# Patient Record
Sex: Female | Born: 1975 | Race: White | Hispanic: No | Marital: Married | Smoking: Never smoker
Health system: Southern US, Community
[De-identification: ages and names within clinical notes are randomized; demographics above are authoritative.]

## PROBLEM LIST (undated history)

## (undated) DIAGNOSIS — B001 Herpesviral vesicular dermatitis: Secondary | ICD-10-CM

## (undated) DIAGNOSIS — Z8619 Personal history of other infectious and parasitic diseases: Secondary | ICD-10-CM

## (undated) HISTORY — DX: Herpesviral vesicular dermatitis: B00.1

## (undated) HISTORY — PX: WISDOM TOOTH EXTRACTION: SHX21

## (undated) HISTORY — DX: Personal history of other infectious and parasitic diseases: Z86.19

## (undated) HISTORY — PX: KNEE SURGERY: SHX244

## (undated) HISTORY — PX: TUBAL LIGATION: SHX77

---

## 2004-05-14 ENCOUNTER — Emergency Department: Payer: Self-pay | Admitting: Emergency Medicine

## 2006-07-05 ENCOUNTER — Emergency Department: Payer: Self-pay | Admitting: Emergency Medicine

## 2006-09-07 ENCOUNTER — Emergency Department: Payer: Self-pay | Admitting: Emergency Medicine

## 2008-07-10 ENCOUNTER — Emergency Department: Payer: Self-pay | Admitting: Emergency Medicine

## 2008-09-15 ENCOUNTER — Emergency Department: Payer: Self-pay | Admitting: Emergency Medicine

## 2009-06-16 ENCOUNTER — Ambulatory Visit: Payer: Self-pay | Admitting: Family Medicine

## 2009-06-16 DIAGNOSIS — Z9189 Other specified personal risk factors, not elsewhere classified: Secondary | ICD-10-CM | POA: Insufficient documentation

## 2009-06-16 DIAGNOSIS — F329 Major depressive disorder, single episode, unspecified: Secondary | ICD-10-CM

## 2009-06-16 DIAGNOSIS — F3289 Other specified depressive episodes: Secondary | ICD-10-CM | POA: Insufficient documentation

## 2009-06-16 DIAGNOSIS — M2550 Pain in unspecified joint: Secondary | ICD-10-CM

## 2009-06-16 DIAGNOSIS — M25569 Pain in unspecified knee: Secondary | ICD-10-CM

## 2009-06-16 DIAGNOSIS — H8110 Benign paroxysmal vertigo, unspecified ear: Secondary | ICD-10-CM | POA: Insufficient documentation

## 2009-06-16 DIAGNOSIS — R21 Rash and other nonspecific skin eruption: Secondary | ICD-10-CM | POA: Insufficient documentation

## 2009-06-16 DIAGNOSIS — E0590 Thyrotoxicosis, unspecified without thyrotoxic crisis or storm: Secondary | ICD-10-CM | POA: Insufficient documentation

## 2009-08-04 ENCOUNTER — Ambulatory Visit: Payer: Self-pay | Admitting: Family Medicine

## 2009-08-04 DIAGNOSIS — M259 Joint disorder, unspecified: Secondary | ICD-10-CM | POA: Insufficient documentation

## 2009-08-05 LAB — CONVERTED CEMR LAB
AST: 16 units/L (ref 0–37)
Albumin: 3.7 g/dL (ref 3.5–5.2)
Alkaline Phosphatase: 80 units/L (ref 39–117)
Bilirubin, Direct: 0.1 mg/dL (ref 0.0–0.3)
Calcium: 8.9 mg/dL (ref 8.4–10.5)
GFR calc non Af Amer: 148.94 mL/min (ref >60)
Glucose, Bld: 82 mg/dL (ref 70–99)
Potassium: 4.2 meq/L (ref 3.5–5.1)
Sodium: 138 meq/L (ref 135–145)
Total CHOL/HDL Ratio: 3
VLDL: 10.6 mg/dL (ref 0.0–40.0)

## 2009-08-07 LAB — CONVERTED CEMR LAB: Cyclic Citrullin Peptide Ab: 1.3 units (ref <7)

## 2009-09-16 ENCOUNTER — Ambulatory Visit: Payer: Self-pay | Admitting: Family Medicine

## 2009-09-16 ENCOUNTER — Encounter (INDEPENDENT_AMBULATORY_CARE_PROVIDER_SITE_OTHER): Payer: Self-pay | Admitting: *Deleted

## 2009-09-16 DIAGNOSIS — G43009 Migraine without aura, not intractable, without status migrainosus: Secondary | ICD-10-CM | POA: Insufficient documentation

## 2009-09-16 LAB — CONVERTED CEMR LAB: KOH Prep: POSITIVE

## 2009-10-20 ENCOUNTER — Other Ambulatory Visit: Admission: RE | Admit: 2009-10-20 | Discharge: 2009-10-20 | Payer: Self-pay | Admitting: Family Medicine

## 2009-10-20 ENCOUNTER — Ambulatory Visit: Payer: Self-pay | Admitting: Family Medicine

## 2009-10-20 ENCOUNTER — Encounter (INDEPENDENT_AMBULATORY_CARE_PROVIDER_SITE_OTHER): Payer: Self-pay | Admitting: *Deleted

## 2009-10-28 DIAGNOSIS — R87811 Vaginal high risk human papillomavirus (HPV) DNA test positive: Secondary | ICD-10-CM

## 2009-11-12 LAB — CONVERTED CEMR LAB: Pap Smear: NEGATIVE

## 2009-11-13 ENCOUNTER — Encounter (INDEPENDENT_AMBULATORY_CARE_PROVIDER_SITE_OTHER): Payer: Self-pay | Admitting: *Deleted

## 2010-06-22 NOTE — Letter (Signed)
Summary: Out of Work  [employer] at [facility]  [address] [place], [place] [address]   Phone: [phone]  Fax: [phone]    September 16, 2009   Employee:  [name]    To Whom It May Concern:   For Medical reasons, please excuse the above named employee from work for the following dates:  Start:  September 15, 2009 3:03 PM   End:   May return on April 28,2011  If you need additional information, please feel free to contact our office.         Sincerely,    Kerby Nora MD

## 2010-06-22 NOTE — Assessment & Plan Note (Signed)
Summary: NEW PT  CYD   Vital Signs:  Patient profile:   35 year old female Height:      63 inches Weight:      193.2 pounds BMI:     34.35 Temp:     98.3 degrees F oral Pulse rate:   76 / minute Pulse rhythm:   regular BP sitting:   110 / 80  (left arm) Cuff size:   regular  Vitals Entered By: Benny Lennert CMA Duncan Dull) (June 16, 2009 10:17 AM)  History of Present Illness: Chief complaint new patient to be established   Has not had regular MD in over 8 years.  Has history of thyroid issues...? high treated with tapazol..has not hasd TSH checked since.   B knee pain...pain with squating or standing a while. Pain getting out of bed in AM and pain after sitting a while. Clicking in knees. No remembered fall or injury.  OCc hip pain.  No redness or swelling.  Occ wrists hurt.Marland Kitchenocc swelling in hands and fingers.   Rash on neck and under breasts.. not itchy...present for years.   Preventive Screening-Counseling & Management  Caffeine-Diet-Exercise     Does Patient Exercise: no      Drug Use:  no.    Problems Prior to Update: None  Current Medications (verified): 1)  None  Past History:  Past medical, surgical, family and social histories (including risk factors) reviewed, and no changes noted (except as noted below).  Past Medical History: Current Problems:  THYROID DISORDER (ICD-246.9) 2000 DEPRESSION (ICD-311) CHICKENPOX, HX OF (ICD-V15.9)    Past Surgical History: tubes tied 2003  Family History: Reviewed history and no changes required. Family History of Arthritis (RA) Grandparents with congestive heart failure mother: unknown father: RA  Social History: Reviewed history and no changes required. 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 10 pack year historyOccupation:  employed Drug Use:  no Does Patient Exercise:  no  Review of Systems General:   Denies fatigue and fever. CV:  Denies chest pain or discomfort. Resp:  Denies shortness of breath. GI:  Denies abdominal pain. GU:  Denies dysuria.  Physical Exam  General:  obese appearing female in NAD Mouth:  Oral mucosa and oropharynx without lesions or exudates.  Teeth in good repair. Neck:  no carotid bruit or thyromegaly no cervical or supraclavicular lymphadenopathy  Lungs:  Normal respiratory effort, chest expands symmetrically. Lungs are clear to auscultation, no crackles or wheezes. Heart:  Normal rate and regular rhythm. S1 and S2 normal without gallop, murmur, click, rub or other extra sounds. Abdomen:  Bowel sounds positive,abdomen soft and non-tender without masses, organomegaly or hernias noted. Msk:  B knees nontender to palp.Marland Kitchenexcept slight pain left lat joint line..no redness no swelling, good stablity, B crepitus Pulses:  R and L posterior tibial pulses are full and equal bilaterally  Extremities:  no edema Skin:  dry scaly skin, pink at neck and on torso..some irregular patches..no central clearing Psych:  Cognition and judgment appear intact. Alert and cooperative with normal attention span and concentration. No apparent delusions, illusions, hallucinations   Impression & Recommendations:  Problem # 1:  HYPERTHYROIDISM (ICD-242.90) ? Marland Kitchen.due for reeval.   Problem # 2:  PAIN IN JOINT, MULTIPLE SITES (ICD-719.49) Has strong family history...does not appear like RA in symptoms or exam, but some wrist pain symetric. Will eval with RF and anti CCP antibody.  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  Follow up if not improving.   Problem # 4:  RASH AND OTHER NONSPECIFIC SKIN ERUPTION (ICD-782.1) Most liekly eczema, ...but given location and worse  in summer consider fungal infection.  Start with steroid cream two times a day x 2 weeks..call if worsening.  Her  updated medication list for this problem includes:    Triamcinolone Acetonide 0.1 % Crea (Triamcinolone acetonide) .Marland Kitchen... Aaa two times a day x 2 weeks, call if rash worsening.  Complete Medication List: 1)  Meclizine Hcl 25 Mg Tabs (Meclizine hcl) .Marland Kitchen.. 1 tab by mouth q 6hour as needed vertigo 2)  Triamcinolone Acetonide 0.1 % Crea (Triamcinolone acetonide) .... Aaa two times a day x 2 weeks, call if rash worsening.  Patient Instructions: 1)  Fasting lipids, CMET Dx v77.91, TSH Dx 244.9 RF, anti ccp antibody 719.94 2)  Scheduled CPX.  3)  Glucosamine 500 mg by mouth three times a day.  4)  MAy also use ibuprofen 800 mg every 8 hours for pain.  Prescriptions: TRIAMCINOLONE ACETONIDE 0.1 % CREA (TRIAMCINOLONE ACETONIDE) AAA two times a day x 2 weeks, call if rash worsening.  #15 gm x 0   Entered and Authorized by:   Kerby Nora MD   Signed by:   Kerby Nora MD on 06/16/2009   Method used:   [facility]        [facility]  [address] [facility]       [address]       [place], [place]  [address]       Ph: [phone] or [phone]       Fax: [phone]   RxID:   [id number]   Flu Vaccine Next Due:  Refused TD Result Date:  05/23/2008 TD Result:  given TD Next Due:  10 yr

## 2010-06-22 NOTE — Letter (Signed)
Summary: Work Dietitian at [facility]  [address] [place], [place] [address]   Phone: [phone]  Fax: [phone]    Today's Date: Oct 20, 2009  Name of Patient: Zoe Martin  The above named patient had a medical visit today at: 9:30 am   Please take this into consideration when reviewing the time away from work/school.    Special Instructions:  [x  ] None  [  ] To be off the remainder of today, returning to the normal work / school schedule tomorrow.  [  ] To be off until the next scheduled appointment on ______________________.  [  ] Other ________________________________________________________________ ________________________________________________________________________   Sincerely yours,   Kerby Nora MD

## 2010-06-22 NOTE — Letter (Signed)
Summary: Results Follow up Letter  [place] at [facility]  [address] [place], [place] [address]   Phone: [phone]  Fax: [phone]    11/13/2009 MRN: [id number]     Zoe Martin [address] [place], [place]  [address]    Dear Ms. Melanee Left,  The following are the results of your recent test(s):  Test         Result    Pap Smear:        Normal __x___  Not Normal _____ Comments:Repeat in 1 year ______________________________________________________ Cholesterol: LDL(Bad cholesterol):         Your goal is less than:         HDL (Good cholesterol):       Your goal is more than: Comments:  ______________________________________________________ Mammogram:        Normal _____  Not Normal _____ Comments:  ___________________________________________________________________ Hemoccult:        Normal _____  Not normal _______ Comments:    _____________________________________________________________________ Other Tests:    We routinely do not discuss normal results over the telephone.  If you desire a copy of the results, or you have any questions about this information we can discuss them at your next office visit.   Sincerely,  Kerby Nora MD

## 2010-06-22 NOTE — Assessment & Plan Note (Signed)
Summary: CPX/DLO   Vital Signs:  Patient profile:   35 year old female Height:      63 inches Weight:      198.8 pounds BMI:     35.34 Temp:     98.4 degrees F oral Pulse rate:   76 / minute Pulse rhythm:   regular BP sitting:   110 / 70  (left arm) Cuff size:   regular  Vitals Entered By: Benny Lennert CMA Duncan Dull) (Oct 20, 2009 9:26 AM)  History of Present Illness: Chief complaint cpx with pap  The patient is here for annual wellness exam and preventative care.    She also has the following chronic and acute issues:  Migraine: Keeping headache diary. 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 Onlt one in last month and a half. 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 Has not had to use triptan.  Exercsiing on treadmill 2 times a week.  Helathy eating.   Problems Prior to Update: 1)  Headache  (ICD-784.0) 2)  Screening For Lipoid Disorders  (ICD-V77.91) 3)  Unspecified Disorder of Hand Joint  (ICD-719.94) 4)  Rash and Other Nonspecific Skin Eruption  (ICD-782.1) 5)  Patello-femoral Syndrome  (ICD-719.46) 6)  Pain in Joint, Multiple Sites  (ICD-719.49) 7)  Benign Positional Vertigo  (ICD-386.11) 8)  Hyperthyroidism  (ICD-242.90) 9)  Hx of Depression  (ICD-311) 10)  Chickenpox, Hx of  (ICD-V15.9)  Current Medications (verified): 1)  Ketoconazole 2 % Crea (Ketoconazole) .... Apply To Affected Area Two Times A Day  Allergies (verified): No Known Drug Allergies  Past History:  Past medical, surgical, family and social histories (including risk factors) reviewed, and no changes noted (except as noted below).  Past Medical History: Reviewed history from 06/16/2009 and no changes required. Current Problems:  THYROID DISORDER (ICD-246.9) 2000 DEPRESSION (ICD-311) CHICKENPOX, HX OF (ICD-V15.9)    Past Surgical History: Reviewed history from 06/16/2009 and no changes required. tubes tied 2003  Family History: Reviewed history from 06/16/2009 and no  changes required. Family History of Arthritis (RA) Grandparents with congestive heart failure mother: unknown father: RA  Social History: Reviewed history from 06/16/2009 and no changes required. 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 10 pack year history  Review of Systems       No intereste din STD testing.  General:  Denies fatigue and fever. CV:  Denies chest pain or discomfort. Resp:  Denies shortness of breath, sputum productive, and wheezing. GI:  Denies abdominal pain, bloody stools, constipation, and diarrhea. GU:  Denies abnormal vaginal bleeding and dysuria. Derm:  Denies lesion(s). Psych:  Denies anxiety and depression.  Physical Exam  General:  obese appearing female in NAD Eyes:  No corneal or conjunctival inflammation noted. EOMI. Perrla. Funduscopic exam benign, without hemorrhages, exudates or papilledema. Vision grossly normal. Ears:  External ear exam shows no significant lesions or deformities.  Otoscopic examination reveals clear canals, tympanic membranes are intact bilaterally without bulging, retraction, inflammation or discharge. Hearing is grossly normal bilaterally. Nose:  External nasal examination shows no deformity or inflammation. Nasal mucosa are pink and moist without lesions or exudates. Mouth:  Oral mucosa and oropharynx without lesions or exudates.  Teeth in good repair. Neck:  No deformities, masses, or tenderness noted. Chest Wall:  No deformities, masses, or tenderness noted. Breasts:  No mass, nodules, thickening, tenderness, bulging, retraction, inflamation, nipple discharge or skin changes noted.   Lungs:  Normal respiratory  effort, chest expands symmetrically. Lungs are clear to auscultation, no crackles or wheezes. Heart:  Normal rate and regular rhythm. S1 and S2 normal without gallop, murmur, click, rub or other extra sounds. Abdomen:   Bowel sounds positive,abdomen soft and non-tender without masses, organomegaly or hernias noted. Genitalia:  Pelvic Exam:        External: normal female genitalia without lesions or masses        Vagina: normal without lesions or masses        Cervix: normal without lesions or masses        Adnexa: normal bimanual exam without masses or fullness        Uterus: normal by palpation        Pap smear: performed Msk:  No deformity or scoliosis noted of thoracic or lumbar spine.   Pulses:  R and L posterior tibial pulses are full and equal bilaterally  Extremities:  no edema Skin:  Intact without suspicious lesions or rashes Psych:  Cognition and judgment appear intact. Alert and cooperative with normal attention span and concentration. No apparent delusions, illusions, hallucinations   Impression & Recommendations:  Problem # 1:  PREVENTIVE HEALTH CARE (ICD-V70.0) The patient's preventative maintenance and recommended screening tests for an annual wellness exam were reviewed in full today. Brought up to date unless services declined.  Counselled on the importance of diet, exercise, and its role in overall health and mortality. The patient's FH and SH was reviewed, including their home life, tobacco status, and drug and alcohol status.     Problem # 2:  ROUTINE GYNECOLOGICAL EXAMINATION (ICD-V72.31) PAP pending. Refused STD testing.   Problem # 3:  MIGRAINE, COMMON (ICD-346.10) Improved.  The following medications were removed from the medication list:    Sumatriptan Succinate 100 Mg Tabs (Sumatriptan succinate) .Marland Kitchen... 1 tab by mouth x 1 may repeat in 2 hours if headache continues.  Problem # 4:  RASH AND OTHER NONSPECIFIC SKIN ERUPTION (ICD-782.1) Resoved tinea versicolor.  Her updated medication list for this problem includes:    Ketoconazole 2 % Crea (Ketoconazole) .Marland Kitchen... Apply to affected area two times a day  Complete Medication List: 1)  Ketoconazole 2 % Crea (Ketoconazole) ....  Apply to affected area two times a day  Patient Instructions: 1)  Increase exercisie, work on healthy eating habits. 2)  You need to lose weight. Consider a lower calorie diet and regular exercise.  3)  Follow up in 1 year or earlier as needed.  Current Allergies (reviewed today): No known allergies

## 2010-06-22 NOTE — Assessment & Plan Note (Signed)
Summary: BAD HEADACHE/DLO   Vital Signs:  Patient profile:   35 year old female Height:      63 inches Weight:      194.6 pounds BMI:     34.60 Temp:     98.2 degrees F oral Pulse rate:   76 / minute Pulse rhythm:   regular BP sitting:   110 / 70  (left arm) Cuff size:   regular  Vitals Entered By: Benny Lennert CMA Duncan Dull) (September 16, 2009 2:22 PM)  History of Present Illness: Chief complaint Bad headache for 2 days  Having headaches once a week in past 2-3 months. This most recent headache has been more severe than others. Very sensitive to light, dizzy. Pain over front of face. Occ sees white spots with bad headaceh. No nausea. Throbbing pain. Using aleve as needed, usually .. last night used percocet leftover..no improvemnt. No numbness, no weakness.   Has appt Friday with eye MD. Straining to see small writing on wires at work.  Feel like neck rash worse..no improvemtn with triamcinolone in last few months..maybe worse. Rash is not itchy.     Problems Prior to Update: 1)  Screening For Lipoid Disorders  (ICD-V77.91) 2)  Unspecified Disorder of Hand Joint  (ICD-719.94) 3)  Rash and Other Nonspecific Skin Eruption  (ICD-782.1) 4)  Patello-femoral Syndrome  (ICD-719.46) 5)  Pain in Joint, Multiple Sites  (ICD-719.49) 6)  Benign Positional Vertigo  (ICD-386.11) 7)  Hyperthyroidism  (ICD-242.90) 8)  Hx of Depression  (ICD-311) 9)  Chickenpox, Hx of  (ICD-V15.9)  Current Medications (verified): 1)  Ketoconazole 2 % Crea (Ketoconazole) .... Apply To Affected Area Two Times A Day 2)  Sumatriptan Succinate 100 Mg Tabs (Sumatriptan Succinate) .Marland Kitchen.. 1 Tab By Mouth X 1 May Repeat in 2 Hours If Headache Continues.  Allergies (verified): No Known Drug Allergies  Past History:  Past medical, surgical, family and social histories (including risk factors) reviewed, and no changes noted (except as noted below).  Past Medical History: Reviewed history from 06/16/2009 and  no changes required. Current Problems:  THYROID DISORDER (ICD-246.9) 2000 DEPRESSION (ICD-311) CHICKENPOX, HX OF (ICD-V15.9)    Past Surgical History: Reviewed history from 06/16/2009 and no changes required. tubes tied 2003  Family History: Reviewed history from 06/16/2009 and no changes required. Family History of Arthritis (RA) Grandparents with congestive heart failure mother: unknown father: RA  Social History: Reviewed history from 06/16/2009 and no changes required. 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 10 pack year history  Review of Systems General:  Denies fatigue and fever. CV:  Denies chest pain or discomfort and swelling of feet. Resp:  Denies shortness of breath. GI:  Denies abdominal pain. GU:  Denies dysuria.  Physical Exam  General:  obese appearing female in NAD Mouth:  Oral mucosa and oropharynx without lesions or exudates.  Teeth in good repair. Neck:  no carotid bruit or thyromegaly .nods  Lungs:  Normal respiratory effort, chest expands symmetrically. Lungs are clear to auscultation, no crackles or wheezes. Heart:  Normal rate and regular rhythm. S1 and S2 normal without gallop, murmur, click, rub or other extra sounds. Pulses:  R and L posterior tibial pulses are full and equal bilaterally  Neurologic:  No cranial nerve deficits noted. Station and gait are normal. Plantar reflexes are down-going bilaterally. DTRs are symmetrical throughout. Sensory, motor and coordinative functions appear intact. Skin:  dry scaly plaques, well coircumscribed, pink  at neck and on torso..some irregular patches..no central clearing but  definate edge apparant Psych:  Cognition and judgment appear intact. Alert and cooperative with normal attention span and concentration. No apparent delusions, illusions, hallucinations   Impression & Recommendations:  Problem # 1:   HEADACHE (ICD-784.0) Migraines triggered b poor lifestyle and eye strain at work. Eye exam recommended. Keep headache doary . Info on triggers to avoid provided.  Triptan for acute headache. Follow up..if still frequent consider prophylaxisis Her updated medication list for this problem includes:    Sumatriptan Succinate 100 Mg Tabs (Sumatriptan succinate) .Marland Kitchen... 1 tab by mouth x 1 may repeat in 2 hours if headache continues.  Headache diary reviewed.  Problem # 2:  RASH AND OTHER NONSPECIFIC SKIN ERUPTION (ICD-782.1)  Worsened with steroid cream..has distinct edge to patches... fungal infection likely.  KOH was positive for fungal structures.  Start topical antifungal.  The following medications were removed from the medication list:    Triamcinolone Acetonide 0.1 % Crea (Triamcinolone acetonide) .Marland Kitchen... Aaa two times a day x 2 weeks, call if rash worsening. Her updated medication list for this problem includes:    Ketoconazole 2 % Crea (Ketoconazole) .Marland Kitchen... Apply to affected area two times a day  Orders: Wet Prep ([id number])  Complete Medication List: 1)  Ketoconazole 2 % Crea (Ketoconazole) .... Apply to affected area two times a day 2)  Sumatriptan Succinate 100 Mg Tabs (Sumatriptan succinate) .Marland Kitchen.. 1 tab by mouth x 1 may repeat in 2 hours if headache continues.  Patient Instructions: 1)  Keep appt for eye exam. 2)  Apply antifungal to rash two times a day until resolved. 3)  Use sumatriptan for headache.  4)  Keep headache diary, increase fluids, rest and exercsie. 5)  Follow up in 3-4 weeks migraine.  Prescriptions: SUMATRIPTAN SUCCINATE 100 MG TABS (SUMATRIPTAN SUCCINATE) 1 tab by mouth x 1 may repeat in 2 hours if headache continues.  #9 x 0   Entered and Authorized by:   Kerby Nora MD   Signed by:   Kerby Nora MD on 09/16/2009   Method used:   [facility] [id number]       [address]       [place], [place]  [address]       Ph:  [phone] or [phone]       Fax: [phone]   RxID:   [id number] KETOCONAZOLE 2 % CREA (KETOCONAZOLE) Apply to affected area two times a day  #30-60 gm x 1   Entered and Authorized by:   Kerby Nora MD   Signed by:   Kerby Nora MD on 09/16/2009   Method used:   [facility]        [facility] [id number]       [address]       [place], [place]  [address]       Ph: [phone] or [phone]       Fax: [phone]   RxID:   [id number]   Current Allergies (reviewed today): No known allergies   Laboratory Results    Wet [place]/KOH Source: skin KOH pos spagetti and meatball fungal strucures

## 2016-12-10 ENCOUNTER — Emergency Department
Admission: EM | Admit: 2016-12-10 | Discharge: 2016-12-10 | Disposition: A | Discharge status: Home / Self Care | Payer: Commercial Managed Care - PPO | Attending: Emergency Medicine | Admitting: Emergency Medicine

## 2016-12-10 ENCOUNTER — Emergency Department: Payer: Commercial Managed Care - PPO

## 2016-12-10 ENCOUNTER — Encounter: Payer: Self-pay | Admitting: Emergency Medicine

## 2016-12-10 DIAGNOSIS — M7122 Synovial cyst of popliteal space [Baker], left knee: Secondary | ICD-10-CM | POA: Diagnosis not present

## 2016-12-10 DIAGNOSIS — Z87891 Personal history of nicotine dependence: Secondary | ICD-10-CM | POA: Insufficient documentation

## 2016-12-10 DIAGNOSIS — M25562 Pain in left knee: Secondary | ICD-10-CM | POA: Diagnosis not present

## 2016-12-10 MED ORDER — NAPROXEN 500 MG PO TABS: 500.0000 mg | ORAL_TABLET | Freq: Two times a day (BID) | ORAL | Status: AC

## 2016-12-10 MED ORDER — OXYCODONE-ACETAMINOPHEN 7.5-325 MG PO TABS: 1.0000 | ORAL_TABLET | Freq: Four times a day (QID) | ORAL | 0 refills | Status: AC | PRN

## 2016-12-10 NOTE — ED Notes (Signed)
Pt verbalizes understanding of discharge instructions.

## 2016-12-10 NOTE — ED Provider Notes (Signed)
[facility] Emergency Department Provider Note   ____________________________________________   First MD Initiated Contact with Patient 12/10/16 1219     (approximate)  I have reviewed the triage vital signs and the nursing notes.   HISTORY  Chief Complaint Knee Pain    HPI Zoe Martin is a 41 y.o. female patient complaining of increasing left knee pain. Patient has a history of meniscus debriefing 4 years ago. Patient states  works requiresprolonged standing. Patient state pain starts after she takes the weight off of the left lower extremity. Patient stated decreased range of motion with flexion of the knee. Patient also complaining of popliteal pain. Patient rates the pain as 8/10. No palliative measures for complaint.   History reviewed. No pertinent past medical history.  Patient Active Problem List   Diagnosis Date Noted  . VAG HIGH RISK HUMAN PAPILLOMAVIRUS DNA TEST POS 10/28/2009  . MIGRAINE, COMMON 09/16/2009  . UNSPECIFIED DISORDER OF HAND JOINT 08/04/2009  . HYPERTHYROIDISM 06/16/2009  . DEPRESSION 06/16/2009  . BENIGN POSITIONAL VERTIGO 06/16/2009  . 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 PAIN IN JOINT, MULTIPLE SITES 06/16/2009  . RASH AND OTHER NONSPECIFIC SKIN ERUPTION 06/16/2009  . CHICKENPOX, HX OF 06/16/2009    Past Surgical History:  Procedure Laterality Date  . KNEE SURGERY Left   . TUBAL LIGATION      Prior to Admission medications   Medication Sig Start Date End Date Taking? Authorizing Provider  naproxen (NAPROSYN) 500 MG tablet Take 1 tablet (500 mg total) by mouth 2 (two) times daily with a meal. 12/10/16   Joni Reining, PA-C  oxyCODONE-acetaminophen (PERCOCET) 7.5-325 MG tablet Take 1 tablet by mouth every 6 (six) hours as needed for severe pain. 12/10/16   Joni Reining, PA-C    Allergies Patient has no known allergies.  No family history on file.  Social History Social History  Substance Use  Topics  . Smoking status: Former Games developer  . Smokeless tobacco: Not on file  . Alcohol use Not on file    Review of Systems  Constitutional: No fever/chills Eyes: No visual changes. ENT: No sore throat. Cardiovascular: Denies chest pain. Respiratory: Denies shortness of breath. Gastrointestinal: No abdominal pain.  No nausea, no vomiting.  No diarrhea.  No constipation. Genitourinary: Negative for dysuria. Musculoskeletal: Left knee pain Skin: Negative for rash. Neurological: Negative for headaches, focal weakness or numbness. Psychiatric:Depression Endocrine:Hypothyroidism ____________________________________________   PHYSICAL EXAM:  VITAL SIGNS: ED Triage Vitals  Enc Vitals Group     BP 12/10/16 1213 136/80     Pulse Rate 12/10/16 1213 71     Resp 12/10/16 1213 20     Temp 12/10/16 1213 98 F (36.7 C)     Temp Source 12/10/16 1213 Oral     SpO2 12/10/16 1213 100 %     Weight 12/10/16 1214 191 lb (86.6 kg)     Height 12/10/16 1214 5\' 3"  (1.6 m)     Head Circumference --      Peak Flow --      Pain Score 12/10/16 1212 8     Pain Loc --      Pain Edu? --      Excl. in GC? --     Constitutional: Alert and oriented. Well appearing and in no acute distress. Cardiovascular: Normal rate, regular rhythm. Grossly normal heart sounds.  Good peripheral circulation. Respiratory: Normal respiratory effort.  No retractions. Lungs CTAB. Gastrointestinal: Soft and nontender. No distention. No abdominal bruits.  No CVA tenderness. Musculoskeletal: No obvious deformity to the left knee. Patient has moderate guarding palpation lateral aspect of the femoral tibial joint. Decreased range of motion with flexion. Neurologic:  Normal speech and language. No gross focal neurologic deficits are appreciated. No gait instability. Skin:  Skin is warm, dry and intact. No rash noted. Psychiatric: Mood and affect are normal. Speech and behavior are  normal.  ____________________________________________   LABS (all labs ordered are listed, but only abnormal results are displayed)  Labs Reviewed - No data to display ____________________________________________  EKG   ____________________________________________  RADIOLOGY  Dg Knee Complete 4 Views Left  Result Date: 12/10/2016 CLINICAL DATA:  Left lateral knee pain. EXAM: LEFT KNEE - COMPLETE 4+ VIEW COMPARISON:  None. FINDINGS: No evidence of fracture, dislocation, or joint effusion. Mild medial joint space narrowing present consistent with osteoarthritis. No bony lesions or destruction. Soft tissues are unremarkable. IMPRESSION: Medial joint space narrowing consistent with osteoarthritis. No acute injury findings. Electronically Signed   By: Irish LackGlenn  Yamagata M.D.   On: 12/10/2016 13:02    _X-rays reveal decreased joint space between the fibula and an tubular on the lateral aspect of the left leg. Patient also has a calcified lesions consistent with Baker's cyst. ___________________________________________   PROCEDURES  Procedure(s) performed: None  Procedures  Critical Care performed: No  ____________________________________________   INITIAL IMPRESSION / ASSESSMENT AND PLAN / ED COURSE  Pertinent labs & imaging results that were available during my care of the patient were reviewed by me and considered in my medical decision making (see chart for details).  Left leg pain secondary to arthritis and Baker's cyst. Discussed x-ray finding with patient. Patient will follow-up with new PCP was scheduled for minute in 2 days. Patient given discharge care instructions and return work no.    ____________________________________________   FINAL CLINICAL IMPRESSION(S) / ED DIAGNOSES  Final diagnoses:  Acute pain of left knee  Baker's cyst of knee, left      NEW MEDICATIONS STARTED DURING THIS VISIT:  New Prescriptions   NAPROXEN (NAPROSYN) 500 MG TABLET    Take 1  tablet (500 mg total) by mouth 2 (two) times daily with a meal.   OXYCODONE-ACETAMINOPHEN (PERCOCET) 7.5-325 MG TABLET    Take 1 tablet by mouth every 6 (six) hours as needed for severe pain.     Note:  This document was prepared using Dragon voice recognition software and may include unintentional dictation errors.    Joni ReiningSmith, Leondro Coryell K, PA-C 12/10/16 1318    Governor RooksLord, Rebecca, MD 12/10/16 [phone]

## 2016-12-10 NOTE — ED Notes (Signed)
Pt reports had knee surgery back in 2014 30% of meniscus removed to left knee and since then pt reports she has been dealing with pain reports past week pain has increased. Denies any falls or injuries to left knee. No visible swelling noted upon assessment, pt denies any other symptom at present

## 2016-12-10 NOTE — ED Triage Notes (Signed)
L knee pain x 1 week. No new injury. History surgery same knee.

## 2016-12-10 NOTE — Discharge Instructions (Signed)
Follow-up for scheduled PCP appointment

## 2016-12-12 ENCOUNTER — Ambulatory Visit: Payer: Self-pay | Admitting: Family

## 2016-12-12 DIAGNOSIS — Z0289 Encounter for other administrative examinations: Secondary | ICD-10-CM

## 2017-12-11 ENCOUNTER — Emergency Department
Admission: EM | Admit: 2017-12-11 | Discharge: 2017-12-12 | Disposition: A | Discharge status: Home / Self Care | Payer: Commercial Managed Care - PPO | Attending: Student in an Organized Health Care Education/Training Program | Admitting: Student in an Organized Health Care Education/Training Program

## 2017-12-11 ENCOUNTER — Encounter: Payer: Self-pay | Admitting: Emergency Medicine

## 2017-12-11 DIAGNOSIS — F332 Major depressive disorder, recurrent severe without psychotic features: Secondary | ICD-10-CM

## 2017-12-11 DIAGNOSIS — R45851 Suicidal ideations: Secondary | ICD-10-CM | POA: Insufficient documentation

## 2017-12-11 DIAGNOSIS — T40601A Poisoning by unspecified narcotics, accidental (unintentional), initial encounter: Secondary | ICD-10-CM

## 2017-12-11 DIAGNOSIS — F1910 Other psychoactive substance abuse, uncomplicated: Secondary | ICD-10-CM

## 2017-12-11 DIAGNOSIS — F1010 Alcohol abuse, uncomplicated: Secondary | ICD-10-CM

## 2017-12-11 DIAGNOSIS — F1110 Opioid abuse, uncomplicated: Secondary | ICD-10-CM | POA: Insufficient documentation

## 2017-12-11 DIAGNOSIS — F329 Major depressive disorder, single episode, unspecified: Secondary | ICD-10-CM | POA: Insufficient documentation

## 2017-12-11 LAB — COMPREHENSIVE METABOLIC PANEL
ALT: 12 U/L (ref 0–44)
AST: 18 U/L (ref 15–41)
Albumin: 4.2 g/dL (ref 3.5–5.0)
Alkaline Phosphatase: 84 U/L (ref 38–126)
Anion gap: 8 (ref 5–15)
BUN: 10 mg/dL (ref 6–20)
CO2: 21 mmol/L — ABNORMAL LOW (ref 22–32)
Calcium: 9 mg/dL (ref 8.9–10.3)
Chloride: 111 mmol/L (ref 98–111)
Creatinine, Ser: 0.74 mg/dL (ref 0.44–1.00)
GFR calc Af Amer: >60 mL/min (ref >60)
GFR calc non Af Amer: >60 mL/min (ref >60)
Glucose, Bld: 141 mg/dL — ABNORMAL HIGH (ref 70–99)
Potassium: 3.6 mmol/L (ref 3.5–5.1)
Sodium: 140 mmol/L (ref 135–145)
Total Bilirubin: 0.8 mg/dL (ref 0.3–1.2)
Total Protein: 7.6 g/dL (ref 6.5–8.1)

## 2017-12-11 LAB — URINE DRUG SCREEN, QUALITATIVE (ARMC ONLY)
AMPHETAMINES, UR SCREEN: NOT DETECTED
BENZODIAZEPINE, UR SCRN: NOT DETECTED
Cannabinoid 50 Ng, Ur ~~LOC~~: NOT DETECTED
Cocaine Metabolite,Ur ~~LOC~~: NOT DETECTED
MDMA (Ecstasy)Ur Screen: NOT DETECTED
Methadone Scn, Ur: NOT DETECTED
Opiate, Ur Screen: NOT DETECTED
Phencyclidine (PCP) Ur S: NOT DETECTED
TRICYCLIC, UR SCREEN: NOT DETECTED

## 2017-12-11 LAB — CBC
HEMATOCRIT: 41 % (ref 35.0–47.0)
HEMOGLOBIN: 14.3 g/dL (ref 12.0–16.0)
MCH: 32.4 pg (ref 26.0–34.0)
MCHC: 34.8 g/dL (ref 32.0–36.0)
MCV: 93.1 fL (ref 80.0–100.0)
Platelets: 285 10*3/uL (ref 150–440)
RBC: 4.4 MIL/uL (ref 3.80–5.20)
RDW: 12.9 % (ref 11.5–14.5)
WBC: 7.7 10*3/uL (ref 3.6–11.0)

## 2017-12-11 LAB — SALICYLATE LEVEL: Salicylate Lvl: <7 mg/dL (ref 2.8–30.0)

## 2017-12-11 LAB — POCT PREGNANCY, URINE: Preg Test, Ur: NEGATIVE

## 2017-12-11 LAB — ACETAMINOPHEN LEVEL: Acetaminophen (Tylenol), Serum: <10 ug/mL — ABNORMAL LOW (ref 10–30)

## 2017-12-11 LAB — ETHANOL: Alcohol, Ethyl (B): 22 mg/dL — ABNORMAL HIGH (ref <10)

## 2017-12-11 MED ORDER — VITAMIN B-1 100 MG PO TABS
100.0000 mg | ORAL_TABLET | Freq: Every day | ORAL | Status: DC
  Administered 2017-12-11 – 2017-12-12 (×2): 100 mg via ORAL
  Filled 2017-12-11 (×2): qty 1

## 2017-12-11 MED ORDER — FOLIC ACID 1 MG PO TABS
1.0000 mg | ORAL_TABLET | Freq: Every day | ORAL | Status: DC
  Administered 2017-12-11 – 2017-12-12 (×2): 1 mg via ORAL
  Filled 2017-12-11 (×2): qty 1

## 2017-12-11 MED ORDER — THIAMINE HCL 100 MG/ML IJ SOLN: 100.0000 mg | Freq: Every day | INTRAMUSCULAR | Status: DC

## 2017-12-11 MED ORDER — ADULT MULTIVITAMIN W/MINERALS CH
1.0000 | ORAL_TABLET | Freq: Every day | ORAL | Status: DC
  Administered 2017-12-11 – 2017-12-12 (×2): 1 via ORAL
  Filled 2017-12-11 (×2): qty 1

## 2017-12-11 MED ORDER — HYDROXYZINE HCL 25 MG PO TABS
50.0000 mg | ORAL_TABLET | Freq: Once | ORAL | Status: AC
Start: 2017-12-11 — End: 2017-12-11
  Administered 2017-12-11: 50 mg via ORAL
  Filled 2017-12-11: qty 2

## 2017-12-11 MED ORDER — LORAZEPAM 1 MG PO TABS
1.0000 mg | ORAL_TABLET | Freq: Four times a day (QID) | ORAL | Status: DC | PRN
  Filled 2017-12-11: qty 1

## 2017-12-11 MED ORDER — LORAZEPAM 2 MG/ML IJ SOLN: 1.0000 mg | Freq: Four times a day (QID) | INTRAMUSCULAR | Status: DC | PRN

## 2017-12-11 NOTE — ED Notes (Signed)
This RN called poison control at this time.

## 2017-12-11 NOTE — ED Notes (Signed)
Pt. Transferred to B[facility] from ED to room 2 after screening for contraband. Report to include Situation, Background, Assessment and [facility]. Pt. Oriented to unit including Q15 minute rounds as well as the security cameras for their protection. Patient is alert and oriented, warm and dry in no acute distress. Patient denies SI, HI, and AVH. Pt. Encouraged to let me know if needs arise.

## 2017-12-11 NOTE — ED Notes (Addendum)
Poison control RN suggested AST/ALT and tylenol level, Salicylate and Aspirin level.  Observe for 6 hours from time of ingestion.  Ideally asymptomatic with normal vitals.  Recommended not giving narcan if patient gets drowsy.  Recommended EKG.

## 2017-12-11 NOTE — ED Notes (Signed)
TTS at bedside. 

## 2017-12-11 NOTE — ED Provider Notes (Addendum)
[facility] Emergency Department Provider Note    First MD Initiated Contact with Patient 12/11/17 1726     (approximate)  I have reviewed the triage vital signs and the nursing notes.   HISTORY  Chief Complaint Suicidal  Level V Caveat:  AMS etoh abuse  HPI Zoe Martin is a 42 y.o. female Zoe Martin intoxicated with report of suicidal ideation.  Patient shakes her head when asked if there is any suicidal ideation right now.  No plan for self-harm.  States when trying to obtain additional history from the patient "Zoe Martin is an Asshole "and that "I want some sprinkles "no evidence of pain.  No intentional self-harm.  States that she had 3 drinks of alcohol.   Please report documents patient took approximately 8 oxycodone.  Patient does not provide any history of this.   History reviewed. No pertinent past medical history. No family history on file. Past Surgical History:  Procedure Laterality Date  . KNEE SURGERY Left   . TUBAL LIGATION     Patient Active Problem List   Diagnosis Date Noted  . Alcohol abuse 12/11/2017  . Opiate overdose (HCC) 12/11/2017  . Severe recurrent major depression without psychotic features (HCC) 12/11/2017  . VAG HIGH RISK HUMAN PAPILLOMAVIRUS DNA TEST POS 10/28/2009  . MIGRAINE, COMMON 09/16/2009  . UNSPECIFIED DISORDER OF HAND JOINT 08/04/2009  . HYPERTHYROIDISM 06/16/2009  . DEPRESSION 06/16/2009  . BENIGN POSITIONAL VERTIGO 06/16/2009  . 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 PAIN IN JOINT, MULTIPLE SITES 06/16/2009  . RASH AND OTHER NONSPECIFIC SKIN ERUPTION 06/16/2009  . CHICKENPOX, HX OF 06/16/2009      Prior to Admission medications   Medication Sig Start Date End Date Taking? Authorizing Provider  naproxen (NAPROSYN) 500 MG tablet Take 1 tablet (500 mg total) by mouth 2 (two) times daily with a meal. 12/10/16   Zoe Reining, PA-C  oxyCODONE-acetaminophen (PERCOCET) 7.5-325 MG tablet Take 1 tablet by  mouth every 6 (six) hours as needed for severe pain. 12/10/16   Zoe Reining, PA-C    Allergies Patient has no known allergies.    Social History Social History   Tobacco Use  . Smoking status: Former Smoker  Substance Use Topics  . Alcohol use: Not on file  . Drug use: Not on file    Review of Systems Patient denies headaches, rhinorrhea, blurry vision, numbness, shortness of breath, chest pain, edema, cough, abdominal pain, nausea, vomiting, diarrhea, dysuria, fevers, rashes or hallucinations unless otherwise stated above in HPI. ____________________________________________   PHYSICAL EXAM:  VITAL SIGNS: Vitals:   12/11/17 1646 12/11/17 2006  BP: 140/69 122/89  Pulse: (!) 105 84  Resp: 18 20  Temp: 99 F (37.2 C) 97.8 F (36.6 C)  SpO2: 98% 99%    Constitutional: Alert but intoxicated appearing Eyes: Conjunctivae are normal.  Head: Atraumatic. Nose: No congestion/rhinnorhea. Mouth/Throat: Mucous membranes are moist.   Neck: No stridor. Painless ROM.  Cardiovascular: Normal rate, regular rhythm. Grossly normal heart sounds.  Good peripheral circulation. Respiratory: Normal respiratory effort.  No retractions. Lungs CTAB. Gastrointestinal: Soft and nontender. No distention. No abdominal bruits. No CVA tenderness. Genitourinary: deferred Musculoskeletal: No lower extremity tenderness nor edema.  No joint effusions. Neurologic: No gross focal neurologic deficits are appreciated. No facial droop Skin:  Skin is warm, dry and intact. No rash noted. Psychiatric: intoxicated ____________________________________________   LABS (all labs ordered are listed, but only abnormal results are displayed)  Results for orders placed or performed  during the hospital encounter of 12/11/17 (from the past 24 hour(s))  Comprehensive metabolic panel     Status: Abnormal   Collection Time: 12/11/17  4:47 PM  Result Value Ref Range   Sodium 140 135 - 145 mmol/L   Potassium 3.6  3.5 - 5.1 mmol/L   Chloride 111 98 - 111 mmol/L   CO2 21 (L) 22 - 32 mmol/L   Glucose, Bld 141 (H) 70 - 99 mg/dL   BUN 10 6 - 20 mg/dL   Creatinine, Ser 1.61 0.44 - 1.00 mg/dL   Calcium 9.0 8.9 - 09.6 mg/dL   Total Protein 7.6 6.5 - 8.1 g/dL   Albumin 4.2 3.5 - 5.0 g/dL   AST 18 15 - 41 U/L   ALT 12 0 - 44 U/L   Alkaline Phosphatase 84 38 - 126 U/L   Total Bilirubin 0.8 0.3 - 1.2 mg/dL   GFR calc non Af Amer >60 >60 mL/min   GFR calc Af Amer >60 >60 mL/min   Anion gap 8 5 - 15  Ethanol     Status: Abnormal   Collection Time: 12/11/17  4:47 PM  Result Value Ref Range   Alcohol, Ethyl (B) 22 (H) <10 mg/dL  Salicylate level     Status: None   Collection Time: 12/11/17  4:47 PM  Result Value Ref Range   Salicylate Lvl <7.0 2.8 - 30.0 mg/dL  Acetaminophen level     Status: Abnormal   Collection Time: 12/11/17  4:47 PM  Result Value Ref Range   Acetaminophen (Tylenol), Serum <10 (L) 10 - 30 ug/mL  cbc     Status: None   Collection Time: 12/11/17  4:47 PM  Result Value Ref Range   WBC 7.7 3.6 - 11.0 K/uL   RBC 4.40 3.80 - 5.20 MIL/uL   Hemoglobin 14.3 12.0 - 16.0 g/dL   HCT 04.5 40.9 - 81.1 %   MCV 93.1 80.0 - 100.0 fL   MCH 32.4 26.0 - 34.0 pg   MCHC 34.8 32.0 - 36.0 g/dL   RDW 91.4 78.2 - 95.6 %   Platelets 285 150 - 440 K/uL  Urine Drug Screen, Qualitative     Status: Abnormal   Collection Time: 12/11/17  4:47 PM  Result Value Ref Range   Tricyclic, Ur Screen NONE DETECTED NONE DETECTED   Amphetamines, Ur Screen NONE DETECTED NONE DETECTED   MDMA (Ecstasy)Ur Screen NONE DETECTED NONE DETECTED   Cocaine Metabolite,Ur [place] NONE DETECTED NONE DETECTED   Opiate, Ur Screen NONE DETECTED NONE DETECTED   Phencyclidine (PCP) Ur S NONE DETECTED NONE DETECTED   Cannabinoid 50 Ng, Ur [place] NONE DETECTED NONE DETECTED   Barbiturates, Ur Screen (A) NONE DETECTED    Result not available. Reagent lot number recalled by manufacturer.   Benzodiazepine, Ur Scrn NONE DETECTED NONE DETECTED    Methadone Scn, Ur NONE DETECTED NONE DETECTED  Pregnancy, urine POC     Status: None   Collection Time: 12/11/17  5:09 PM  Result Value Ref Range   Preg Test, Ur NEGATIVE NEGATIVE   ____________________________________________  EKG My review and personal interpretation at Time: 17:23   Indication: med eval  Rate: 90  Rhythm: sinus Axis: normal Other: normal intervals, no stemi ____________________________________________  RADIOLOGY   ____________________________________________   PROCEDURES  Procedure(s) performed:  Procedures    Critical Care performed: no ____________________________________________   INITIAL IMPRESSION / ASSESSMENT AND PLAN / ED COURSE  Pertinent labs & imaging results that were available  during my care of the patient were reviewed by me and considered in my medical decision making (see chart for details).   DDX: Psychosis, delirium, medication effect, noncompliance, polysubstance abuse, Si, Hi, depression   Zoe Martin is a 42 y.o. who presents to the ED with for evaluation of SI.  Patient has psych history of etoh abuse.  Laboratory testing was ordered to evaluation for underlying electrolyte derangement or signs of underlying organic pathology to explain today's presentation.  Based on history and physical and laboratory evaluation, it appears that the patient's presentation is 2/2 underlying psychiatric disorder and will require further evaluation and management by inpatient psychiatry.  Patient was  made an IVC due to etoh intoxication with report of SI.  Disposition pending psychiatric evaluation.   Clinical Course as of Dec 11 2129  Mon Dec 11, 2017  2027 Poison control was notified by a similar facility.  They called back concerning prolonged QT however on review of the patient's EKG her QT is normal.  Machine is likely misinterpreting some baseline artifact.   [PR]  2041 Repeat EKG shows less baseline artifact with a QT of 414   [PR]      Clinical Course User Index [PR] Willy Eddyobinson, Lelan Cush, MD     As part of my medical decision making, I reviewed the following data within the electronic MEDICAL RECORD NUMBER Nursing notes reviewed and incorporated, Labs reviewed, notes from prior ED visits   ____________________________________________   FINAL CLINICAL IMPRESSION(S) / ED DIAGNOSES  Final diagnoses:  Suicidal ideation  Polysubstance abuse (HCC)      NEW MEDICATIONS STARTED DURING THIS VISIT:  New Prescriptions   No medications on file     Note:  This document was prepared using Dragon voice recognition software and may include unintentional dictation errors.    Willy Eddyobinson, Nur Rabold, MD 12/11/17 1740    Willy Eddyobinson, Jayd Cadieux, MD 12/11/17 2131

## 2017-12-11 NOTE — ED Notes (Signed)
IVC/Consult completed/ Pending Disposition 

## 2017-12-11 NOTE — BH Assessment (Signed)
TTS attempted assessment however pt too intoxicated to participate. [facility] will assess later this evening. Pt meets criteria for inpatient treatment per Dr. Toni Amendlapacs.

## 2017-12-11 NOTE — ED Notes (Signed)
Per IVC papers patient took 1/2 a bottle of percocet, approx. 8 tablets of 5/325

## 2017-12-11 NOTE — ED Notes (Addendum)
[facility] Romeo AppleBen[facility]  Patient has been cleared by Poison Control.

## 2017-12-11 NOTE — ED Notes (Signed)
Patient belongings placed in bag including, pants, shirt, sandals, small plush toy, cell phone, charger, and keys.

## 2017-12-11 NOTE — ED Notes (Signed)
Referral information for Psychiatric Hospitalization faxed to;   [facility] [phone]),   . [facility] ([phone]--[phone])  . [facility] [phone]),  . [facility] [phone] or [phone]),   . [place] [phone] or [phone])  . [facility] [phone]),   . [facility] [phone]),   . [facility] [phone]  . [facility] [phone] or [phone])

## 2017-12-11 NOTE — ED Notes (Signed)
[facility]  Preparing [facility] to transfer.

## 2017-12-11 NOTE — BH Assessment (Signed)
Assessment Note  Zoe Martin is an 42 y.o. female. Zoe Martin arrived to the ED by way or the police.  She reports that a friend of hers called the cops.  She states that he overreacted.  He assumed that she was in danger.  She reports that she took some pain killers and alcohol to get some sleep, and she has yet to get any sleep. She reports that her brain does not cut off for her to sleep.  She states that her job is stressful and she does not get any sleep. She states that she took the alcohol to shut her mind off to get some sleep. She denied symptoms of depression or anxiety. She denied having auditory or visual hallucinations.  She denied suicidal or homicidal ideation or intent.  She reports some life stressors.  IVC paperwork reports, "Texted friend she took  bottle of pills took approx. 8 oxi-codone"   Diagnosis: Substance misuse  Past Medical History: History reviewed. No pertinent past medical history.  Past Surgical History:  Procedure Laterality Date  . KNEE SURGERY Left   . TUBAL LIGATION      Family History: No family history on file.  Social History:  reports that she has quit smoking. She does not have any smokeless tobacco history on file. Her alcohol and drug histories are not on file.  Additional Social History:  Alcohol / Drug Use History of alcohol / drug use?: No history of alcohol / drug abuse(Patient denied history of substance abuse)  CIWA: CIWA-Ar BP: 122/89 Pulse Rate: 84 Nausea and Vomiting: no nausea and no vomiting Tactile Disturbances: none Tremor: no tremor Auditory Disturbances: not present Paroxysmal Sweats: no sweat visible Visual Disturbances: not present Anxiety: mildly anxious Headache, Fullness in Head: none present Agitation: normal activity Orientation and Clouding of Sensorium: oriented and can do serial additions CIWA-Ar Total: 1 COWS:    Allergies: No Known Allergies  Home Medications:  (Not in a hospital  admission)  OB/GYN Status:  Patient's last menstrual period was 11/11/2017 (approximate).  General Assessment Data Location of Assessment: [facility] ED TTS Assessment: In system Is this a Tele or Face-to-Face Assessment?: Face-to-Face Is this an Initial Assessment or a Re-assessment for this encounter?: Initial Assessment Marital status: Separated Maiden name: Primitivo GauzeFletcher Is patient pregnant?: No Pregnancy Status: No Living Arrangements: Alone Can pt return to current living arrangement?: Yes Admission Status: Involuntary Is patient capable of signing voluntary admission?: No Referral Source: Self/Family/Friend Insurance type: None  Medical Screening Exam [facility]) Medical Exam completed: Yes  Crisis Care Plan Living Arrangements: Alone Legal Guardian: Other:(Self) Name of Psychiatrist: None Name of Therapist: None  Education Status Is patient currently in school?: No Is the patient employed, unemployed or receiving disability?: Employed  Risk to self with the past 6 months Suicidal Ideation: No Has patient been a risk to self within the past 6 months prior to admission? : No Suicidal Intent: No Has patient had any suicidal intent within the past 6 months prior to admission? : No Is patient at risk for suicide?: No Suicidal Plan?: No Has patient had any suicidal plan within the past 6 months prior to admission? : No Access to Means: No What has been your use of drugs/alcohol within the last 12 months?: occasional use of alcohol Previous Attempts/Gestures: Yes How many times?: 1(in 2000) Other Self Harm Risks: Denied Triggers for Past Attempts: Unknown Intentional Self Injurious Behavior: None Family Suicide History: No Recent stressful life event(s): Other (Comment)(Difficulty  sleeping, work stress, personal life stressors) Persecutory voices/beliefs?: No Depression: No Depression Symptoms: (Denied) Substance abuse history and/or treatment for substance abuse?:  No Suicide prevention information given to non-admitted patients: Not applicable  Risk to Others within the past 6 months Homicidal Ideation: No Does patient have any lifetime risk of violence toward others beyond the six months prior to admission? : No Thoughts of Harm to Others: No Current Homicidal Intent: No Current Homicidal Plan: No Access to Homicidal Means: No Identified Victim: None identified History of harm to others?: No Assessment of Violence: None Noted Violent Behavior Description: denied Does patient have access to weapons?: No Criminal Charges Pending?: No Does patient have a court date: No Is patient on probation?: No  Psychosis Hallucinations: None noted Delusions: None noted  Mental Status Report Appearance/Hygiene: In scrubs Eye Contact: Good Motor Activity: Unremarkable Speech: Logical/coherent Level of Consciousness: Alert Mood: Irritable Affect: Irritable Anxiety Level: None Thought Processes: Coherent Judgement: Partial Obsessive Compulsive Thoughts/Behaviors: None  Cognitive Functioning Concentration: Normal Is patient IDD: No Is patient DD?: No Insight: Poor Impulse Control: Fair Appetite: Good Have you had any weight changes? : No Change Sleep: Decreased(Reports insomnia) Vegetative Symptoms: None  ADLScreening [facility] Assessment Services) Patient's cognitive ability adequate to safely complete daily activities?: Yes Patient able to express need for assistance with ADLs?: Yes Independently performs ADLs?: Yes (appropriate for developmental age)  Prior Inpatient Therapy Prior Inpatient Therapy: No  Prior Outpatient Therapy Prior Outpatient Therapy: Yes Prior Therapy Dates: 1999 Prior Therapy Facilty/Provider(s): [facility] Reason for Treatment: Depression Does patient have an ACCT team?: No Does patient have Intensive In-House Services?  : No Does patient have [facility] services? : No Does patient have P4CC services?:  No  ADL Screening (condition at time of admission) Patient's cognitive ability adequate to safely complete daily activities?: Yes Is the patient deaf or have difficulty hearing?: No Does the patient have difficulty seeing, even when wearing glasses/contacts?: No Does the patient have difficulty concentrating, remembering, or making decisions?: No Patient able to express need for assistance with ADLs?: Yes Does the patient have difficulty dressing or bathing?: No Independently performs ADLs?: Yes (appropriate for developmental age) Does the patient have difficulty walking or climbing stairs?: No Weakness of Legs: None Weakness of Arms/Hands: None  Home Assistive Devices/Equipment Home Assistive Devices/Equipment: None    Abuse/Neglect Assessment (Assessment to be complete while patient is alone) Abuse/Neglect Assessment Can Be Completed: (Patient denied a history of abuse)     Advance Directives (For Healthcare) Does Patient Have a Medical Advance Directive?: No Would patient like information on creating a medical advance directive?: No - Patient declined          Disposition:  Disposition Initial Assessment Completed for this Encounter: Yes  On Site Evaluation by:   Reviewed with Physician:    Justice Deeds 12/11/2017 8:55 PM

## 2017-12-11 NOTE — ED Notes (Signed)
Pt placed in Family Consult room, [employer] officer present.

## 2017-12-11 NOTE — ED Notes (Signed)
Patient on phone with daughter. (Daughter called and did not have pass code.  Writer spoke with patient and informed her daughter was on phone.  Patient agreed to speak with patient.)  Patient irritable saying, "Who told you I was here?  I can't sleep.  I haven't slept in days.  I just want to sleep."

## 2017-12-11 NOTE — ED Notes (Signed)
First Nurse Note:  Pt in via [facility] with [employer] officer, states he is awaiting IVC papers.  Per EMS reports, pt expressed SI via a text message to a friend.  Pt reports taking 5 Hydrocodone today and having 3 Vodka drinks.  Pt drowsy but easily arousable.

## 2017-12-11 NOTE — ED Notes (Signed)
Hourly rounding reveals patient sleeping in room. No complaints, stable, in no acute distress. Q15 minute rounds and monitoring via Security Cameras to continue. 

## 2017-12-11 NOTE — ED Notes (Signed)
Pt changed into behavorial clothing by this tech and Lorel MonacoLaura M (EDT), items obtained are as follows.....  Teal colored spaghetti strapped tank top, 1 brown pair of leggings, 1 white strapped sandal, 1 grey colored necklace with diamond shaped teal colored stone, 1 silver colored ring with 3 clear stones in it. 1 grey colored men's ring with silver colored angel wing attached  All items listed above placed in pt belongings bag by this tech and Lorel MonacoLaura M (EDT) name was labeled on outside.

## 2017-12-11 NOTE — Consult Note (Signed)
[facility] Psychiatry Consult   Reason for Consult: Consult for this 42 year old woman brought in after a friend felt that she had made suicidal statements Referring Physician: Quentin Cornwall Patient Identification: Zoe Martin MRN:  [id number] Principal Diagnosis: Severe recurrent major depression without psychotic features [facility]) Diagnosis:   Patient Active Problem List   Diagnosis Date Noted  . Alcohol abuse [F10.10] 12/11/2017  . Opiate overdose ([place]) [T40.601A] 12/11/2017  . Severe recurrent major depression without psychotic features ([place]) [F33.2] 12/11/2017  . VAG HIGH RISK HUMAN PAPILLOMAVIRUS DNA TEST POS [R87.811] 10/28/2009  . MIGRAINE, COMMON [G43.009] 09/16/2009  . UNSPECIFIED DISORDER OF HAND JOINT [M25.9] 08/04/2009  . HYPERTHYROIDISM [E05.90] 06/16/2009  . DEPRESSION [F32.9] 06/16/2009  . BENIGN POSITIONAL VERTIGO [H81.10] 06/16/2009  . 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 PAIN IN JOINT, MULTIPLE SITES [M25.50] 06/16/2009  . RASH AND OTHER NONSPECIFIC SKIN ERUPTION [R21] 06/16/2009  . CHICKENPOX, HX OF [Z91.89] 06/16/2009    Total Time spent with patient: 1 hour  Subjective:   Zoe Martin is a 42 y.o. female patient admitted with "[name] is an ass hat".  HPI: Patient seen chart reviewed.  Patient brought here after a friend called 911 because of concern that the patient might be suicidal.  Apparently the patient made statements to the person about "just wanting to go to sleep" or something to that effect but her friend interpreted this as being potentially suicidal.  Patient does state that she took all of the "painkillers" that were left in a bottle that she had because she wanted to go to sleep.  Also says that she had had several large glasses of orange juice and vodka today.  Patient is tearful and reports that she is chronically overwhelmed and stressed out and depressed.  Says that she has suicidal thoughts "every day".  Denies  hallucinations.  Complains bitterly of chronic insomnia.  Says that she cannot ever get a decent night sleep and that she works long hours every day and feels exhausted and emotionally overwhelmed.  Not currently getting any outpatient psychiatric treatment.  Medical history: Multiple transient medical problems does not appear to have any active ongoing medical issues.  Social history: Lives by herself.  Works as a Freight forwarder at a [employer].  Substance abuse history: Patient says she has been drinking more and more every day.  Drinks about a half of a bottle of vodka every day.  Also says that she uses a lot of pain medicine although there are not a lot of prescriptions documented.  Denies other drug use.  Past Psychiatric History: Patient states that she tried to kill herself by cutting her wrists in 2000.  Did not wind up apparently in a psychiatric hospital.  Not getting any active mental health treatment.  Does not recall any previous medication treatment.  Risk to Self:   Risk to Others:   Prior Inpatient Therapy:   Prior Outpatient Therapy:    Past Medical History: History reviewed. No pertinent past medical history.  Past Surgical History:  Procedure Laterality Date  . KNEE SURGERY Left   . TUBAL LIGATION     Family History: No family history on file. Family Psychiatric  History: Denies any family history Social History:  Social History   Substance and Sexual Activity  Alcohol Use Not on file     Social History   Substance and Sexual Activity  Drug Use Not on file    Social History   Socioeconomic History  . Marital  status: Married    Spouse name: Not on file  . Number of children: Not on file  . Years of education: Not on file  . Highest education level: Not on file  Occupational History  . Not on file  Social Needs  . Financial resource strain: Not on file  . Food insecurity:    Worry: Not on file    Inability: Not on file  . Transportation needs:     Medical: Not on file    Non-medical: Not on file  Tobacco Use  . Smoking status: Former Smoker  Substance and Sexual Activity  . Alcohol use: Not on file  . Drug use: Not on file  . Sexual activity: Not on file  Lifestyle  . Physical activity:    Days per week: Not on file    Minutes per session: Not on file  . Stress: Not on file  Relationships  . Social connections:    Talks on phone: Not on file    Gets together: Not on file    Attends religious service: Not on file    Active member of club or organization: Not on file    Attends meetings of clubs or organizations: Not on file    Relationship status: Not on file  Other Topics Concern  . Not on file  Social History Narrative  . Not on file   Additional Social History:    Allergies:  No Known Allergies  Labs:  Results for orders placed or performed during the hospital encounter of 12/11/17 (from the past 48 hour(s))  Comprehensive metabolic panel     Status: Abnormal   Collection Time: 12/11/17  4:47 PM  Result Value Ref Range   Sodium 140 135 - 145 mmol/L   Potassium 3.6 3.5 - 5.1 mmol/L   Chloride 111 98 - 111 mmol/L   CO2 21 (L) 22 - 32 mmol/L   Glucose, Bld 141 (H) 70 - 99 mg/dL   BUN 10 6 - 20 mg/dL   Creatinine, Ser 0.74 0.44 - 1.00 mg/dL   Calcium 9.0 8.9 - 10.3 mg/dL   Total Protein 7.6 6.5 - 8.1 g/dL   Albumin 4.2 3.5 - 5.0 g/dL   AST 18 15 - 41 U/L   ALT 12 0 - 44 U/L   Alkaline Phosphatase 84 38 - 126 U/L   Total Bilirubin 0.8 0.3 - 1.2 mg/dL   GFR calc non Af Amer >60 >60 mL/min   GFR calc Af Amer >60 >60 mL/min    Comment: (NOTE) The eGFR has been calculated using the CKD EPI equation. This calculation has not been validated in all clinical situations. eGFR's persistently <60 mL/min signify possible Chronic Kidney Disease.    Anion gap 8 5 - 15    Comment: Performed at [facility], [address] [place], [place] [address]  Ethanol     Status: Abnormal   Collection Time: 12/11/17   4:47 PM  Result Value Ref Range   Alcohol, Ethyl (B) 22 (H) <10 mg/dL    Comment: (NOTE) Lowest detectable limit for serum alcohol is 10 mg/dL. For medical purposes only. Performed at [facility], [address] [place], [place] [address]   Salicylate level     Status: None   Collection Time: 12/11/17  4:47 PM  Result Value Ref Range   Salicylate Lvl <0.0 2.8 - 30.0 mg/dL    Comment: Performed at [facility], [address] [place],  [address]  Acetaminophen level     Status: Abnormal   Collection Time: 12/11/17  4:47 PM  Result Value Ref Range   Acetaminophen (Tylenol), Serum <10 (L) 10 - 30 ug/mL    Comment: (NOTE) Therapeutic concentrations vary significantly. A range of 10-30 ug/mL  may be an effective concentration for many patients. However, some  are best treated at concentrations outside of this range. Acetaminophen concentrations >150 ug/mL at 4 hours after ingestion  and >50 ug/mL at 12 hours after ingestion are often associated with  toxic reactions. Performed at [facility], [address] [place], [place] [address]   cbc     Status: None   Collection Time: 12/11/17  4:47 PM  Result Value Ref Range   WBC 7.7 3.6 - 11.0 K/uL   RBC 4.40 3.80 - 5.20 MIL/uL   Hemoglobin 14.3 12.0 - 16.0 g/dL   HCT 41.0 35.0 - 47.0 %   MCV 93.1 80.0 - 100.0 fL   MCH 32.4 26.0 - 34.0 pg   MCHC 34.8 32.0 - 36.0 g/dL   RDW 12.9 11.5 - 14.5 %   Platelets 285 150 - 440 K/uL    Comment: Performed at [facility], [address] [place], [place] [address]  Urine Drug Screen, Qualitative     Status: Abnormal   Collection Time: 12/11/17  4:47 PM  Result Value Ref Range   Tricyclic, Ur Screen NONE DETECTED NONE DETECTED   Amphetamines, Ur Screen NONE DETECTED NONE DETECTED   MDMA (Ecstasy)Ur Screen NONE DETECTED NONE DETECTED   Cocaine Metabolite,Ur [place] NONE DETECTED NONE DETECTED   Opiate, Ur Screen NONE DETECTED NONE DETECTED    Phencyclidine (PCP) Ur S NONE DETECTED NONE DETECTED   Cannabinoid 50 Ng, Ur [place] NONE DETECTED NONE DETECTED   Barbiturates, Ur Screen (A) NONE DETECTED    Result not available. Reagent lot number recalled by manufacturer.   Benzodiazepine, Ur Scrn NONE DETECTED NONE DETECTED   Methadone Scn, Ur NONE DETECTED NONE DETECTED    Comment: (NOTE) Tricyclics + metabolites, urine    Cutoff 1000 ng/mL Amphetamines + metabolites, urine  Cutoff 1000 ng/mL MDMA (Ecstasy), urine              Cutoff 500 ng/mL Cocaine Metabolite, urine          Cutoff 300 ng/mL Opiate + metabolites, urine        Cutoff 300 ng/mL Phencyclidine (PCP), urine         Cutoff 25 ng/mL Cannabinoid, urine                 Cutoff 50 ng/mL Barbiturates + metabolites, urine  Cutoff 200 ng/mL Benzodiazepine, urine              Cutoff 200 ng/mL Methadone, urine                   Cutoff 300 ng/mL The urine drug screen provides only a preliminary, unconfirmed analytical test result and should not be used for non-medical purposes. Clinical consideration and professional judgment should be applied to any positive drug screen result due to possible interfering substances. A more specific alternate chemical method must be used in order to obtain a confirmed analytical result. Gas chromatography / mass spectrometry (GC/MS) is the preferred confirmat ory method. Performed at [facility], [address], [place], [place] [address]   Pregnancy, urine POC     Status: None   Collection Time: 12/11/17  5:09 PM  Result Value  Ref Range   Preg Test, Ur NEGATIVE NEGATIVE    Comment:        THE SENSITIVITY OF THIS METHODOLOGY IS >24 mIU/mL     No current facility-administered medications for this encounter.    Current Outpatient Medications  Medication Sig Dispense Refill  . naproxen (NAPROSYN) 500 MG tablet Take 1 tablet (500 mg total) by mouth 2 (two) times daily with a meal. 20 tablet 00  . oxyCODONE-acetaminophen  (PERCOCET) 7.5-325 MG tablet Take 1 tablet by mouth every 6 (six) hours as needed for severe pain. 12 tablet 0    Musculoskeletal: Strength & Muscle Tone: within normal limits Gait & Station: unable to stand Patient leans: N/A  Psychiatric Specialty Exam: Physical Exam  Nursing note and vitals reviewed. Constitutional: She appears well-developed and well-nourished.  HENT:  Head: Normocephalic and atraumatic.  Eyes: Pupils are equal, round, and reactive to light. Conjunctivae are normal.  Neck: Normal range of motion.  Cardiovascular: Regular rhythm and normal heart sounds.  Respiratory: Effort normal. No respiratory distress.  GI: Soft.  Musculoskeletal: Normal range of motion.  Neurological: She is alert.  Skin: Skin is warm and dry.  Psychiatric: Her mood appears anxious. Her affect is blunt. Her speech is delayed. She is agitated, slowed and withdrawn. She is not aggressive. Thought content is not paranoid. Cognition and memory are impaired. She expresses impulsivity. She expresses suicidal ideation. She expresses no homicidal ideation.    Review of Systems  Constitutional: Positive for malaise/fatigue.  HENT: Negative.   Eyes: Negative.   Respiratory: Negative.   Cardiovascular: Negative.   Gastrointestinal: Negative.   Musculoskeletal: Negative.   Skin: Negative.   Neurological: Negative.   Psychiatric/Behavioral: Positive for depression, substance abuse and suicidal ideas. Negative for hallucinations and memory loss. The patient is nervous/anxious and has insomnia.     Blood pressure 140/69, pulse (!) 105, temperature 99 F (37.2 C), temperature source Oral, resp. rate 18, height 5' 3"  (1.6 m), weight 88.5 kg (195 lb), last menstrual period 11/11/2017, SpO2 98 %.Body mass index is 34.54 kg/m.  General Appearance: Disheveled  Eye Contact:  Minimal  Speech:  Slow and Slurred  Volume:  Decreased  Mood:  Anxious, Depressed and Irritable  Affect:  Congruent  Thought  Process:  Disorganized  Orientation:  Full (Time, Place, and Person)  Thought Content:  Illogical, Rumination and Tangential  Suicidal Thoughts:  Yes.  with intent/plan  Homicidal Thoughts:  No  Memory:  Immediate;   Fair Recent;   Poor Remote;   Fair  Judgement:  Impaired  Insight:  Shallow  Psychomotor Activity:  Decreased  Concentration:  Concentration: Poor  Recall:  Poor  Fund of Knowledge:  Poor  Language:  Fair  Akathisia:  No  Handed:  Right  AIMS (if indicated):     Assets:  Financial Resources/Insurance Housing  ADL's:  Impaired  Cognition:  Impaired,  Mild  Sleep:        Treatment Plan Summary: Plan This is a 42 year old woman who comes into the hospital after making statements that her friend interpreted as being suicidal.  Indeed the patient did consume multiple narcotic pain medicines in an attempt to "go to sleep" on top of drinking heavily.  Currently her blood alcohol level is only 22 but she looks like she is still intoxicated.  Drug screen is not showing any opiates which suggests that it was probably the oxycodone that was prescribed a year ago that she took.  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  Additionally she talks about having suicidal thoughts "every day".  She is tearful and agitated.  Patient appears to still meet commitment criteria and requires inpatient treatment.  Case reviewed with ER doctor.  We can put her on detox protocol as needed.  Recommend admission.  Case reviewed with TTS and ER.  Disposition: Recommend psychiatric Inpatient admission when medically cleared. Supportive therapy provided about ongoing stressors.  Alethia Berthold, MD 12/11/2017 6:36 PM

## 2017-12-11 NOTE — ED Triage Notes (Signed)
Patient presents to the ED and is very tearful and occasionally hyperventilating in triage.  Patient came to the ED via EMS from home with IVC papers.  Patient states she texted her friend alex about taking a couple of pills after drinking a couple of alcoholic drinks.  Patient's friend then called 9-1-1.  Patient denies suicidal ideation at this time.

## 2017-12-11 NOTE — ED Notes (Signed)
Hourly rounding reveals patient in room. No complaints, stable, in no acute distress. Q15 minute rounds and monitoring via Security Cameras to continue. 

## 2017-12-12 ENCOUNTER — Inpatient Hospital Stay: Admission: AD | Admit: 2017-12-12 | Payer: No Typology Code available for payment source | Admitting: Psychiatry

## 2017-12-12 NOTE — ED Notes (Signed)
Hourly rounding reveals patient sleeping in room. No complaints, stable, in no acute distress. Q15 minute rounds and monitoring via Security Cameras to continue. 

## 2017-12-12 NOTE — ED Notes (Signed)
Pt given all belongings at this time, pt changing clothes. Denies any belongings missing from bag.

## 2017-12-12 NOTE — Consult Note (Signed)
[place] [facility] Consult   Reason for Consult: Follow-up note for this 43 year old woman who has been in the emergency room overnight Referring Physician: Archie Balboa Patient Identification: Zoe Martin MRN:  [id number] Principal Diagnosis: Severe recurrent major depression without psychotic features [facility]) Diagnosis:   Patient Active Problem List   Diagnosis Date Noted  . Alcohol abuse [F10.10] 12/11/2017  . Opiate overdose ([place]) [T40.601A] 12/11/2017  . Severe recurrent major depression without psychotic features ([place]) [F33.2] 12/11/2017  . VAG HIGH RISK HUMAN PAPILLOMAVIRUS DNA TEST POS [R87.811] 10/28/2009  . MIGRAINE, COMMON [G43.009] 09/16/2009  . UNSPECIFIED DISORDER OF HAND JOINT [M25.9] 08/04/2009  . HYPERTHYROIDISM [E05.90] 06/16/2009  . DEPRESSION [F32.9] 06/16/2009  . BENIGN POSITIONAL VERTIGO [H81.10] 06/16/2009  . 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 PAIN IN JOINT, MULTIPLE SITES [M25.50] 06/16/2009  . RASH AND OTHER NONSPECIFIC SKIN ERUPTION [R21] 06/16/2009  . CHICKENPOX, HX OF [Z91.89] 06/16/2009    Total Time spent with patient: 30 minutes  Subjective:   Zoe Martin is a 42 y.o. female patient admitted with "I am not going to kill myself".  HPI: Patient reassessed.  This 42 year old woman came into the hospital yesterday after taking an overdose of pain pills.  She was very agitated yesterday and there was indication of possible suicidal ideation.  Plan was for admission to the psychiatric unit but no bed had been available.  On reevaluation today the patient had gotten some sleep overnight.  She was adamant in stating that she had no suicidal ideation at all.  She was articulate about a plan for her career and her family into the future.  She repeated that her chief complaint is difficulty sleeping and that she simply wanted to get some sleep during the day yesterday.  Patient has not been acting out to try to harm herself in the  hospital.  Does not show symptoms of psychosis.  She is requesting released from the emergency room.  Past Psychiatric History: Patient has a apparent history of alcohol abuse but does not seem to have been involved in significant treatment for her mood in the past  Risk to Self: Suicidal Ideation: No Suicidal Intent: No Is patient at risk for suicide?: No Suicidal Plan?: No Access to Means: No What has been your use of drugs/alcohol within the last 12 months?: occasional use of alcohol How many times?: 1(in 2000) Other Self Harm Risks: Denied Triggers for Past Attempts: Unknown Intentional Self Injurious Behavior: None Risk to Others: Homicidal Ideation: No Thoughts of Harm to Others: No Current Homicidal Intent: No Current Homicidal Plan: No Access to Homicidal Means: No Identified Victim: None identified History of harm to others?: No Assessment of Violence: None Noted Violent Behavior Description: denied Does patient have access to weapons?: No Criminal Charges Pending?: No Does patient have a court date: No Prior Inpatient Therapy: Prior Inpatient Therapy: No Prior Outpatient Therapy: Prior Outpatient Therapy: Yes Prior Therapy Dates: 1999 Prior Therapy Facilty/Provider(s): [employer] [facility] Reason for Treatment: Depression Does patient have an ACCT team?: No Does patient have Intensive In-House Services?  : No Does patient have [facility] services? : No Does patient have P4CC services?: No  Past Medical History: History reviewed. No pertinent past medical history.  Past Surgical History:  Procedure Laterality Date  . KNEE SURGERY Left   . TUBAL LIGATION     Family History: No family history on file. Family Psychiatric  History: See previous note Social History:  Social History   Substance and Sexual Activity  Alcohol Use Not on file     Social History   Substance and Sexual Activity  Drug Use Not on file    Social History   Socioeconomic History  .  Marital status: Married    Spouse name: Not on file  . Number of children: Not on file  . Years of education: Not on file  . Highest education level: Not on file  Occupational History  . Not on file  Social Needs  . Financial resource strain: Not on file  . Food insecurity:    Worry: Not on file    Inability: Not on file  . Transportation needs:    Medical: Not on file    Non-medical: Not on file  Tobacco Use  . Smoking status: Former Smoker  Substance and Sexual Activity  . Alcohol use: Not on file  . Drug use: Not on file  . Sexual activity: Not on file  Lifestyle  . Physical activity:    Days per week: Not on file    Minutes per session: Not on file  . Stress: Not on file  Relationships  . Social connections:    Talks on phone: Not on file    Gets together: Not on file    Attends religious service: Not on file    Active member of club or organization: Not on file    Attends meetings of clubs or organizations: Not on file    Relationship status: Not on file  Other Topics Concern  . Not on file  Social History Narrative  . Not on file   Additional Social History:    Allergies:  No Known Allergies  Labs:  Results for orders placed or performed during the hospital encounter of 12/11/17 (from the past 48 hour(s))  Comprehensive metabolic panel     Status: Abnormal   Collection Time: 12/11/17  4:47 PM  Result Value Ref Range   Sodium 140 135 - 145 mmol/L   Potassium 3.6 3.5 - 5.1 mmol/L   Chloride 111 98 - 111 mmol/L   CO2 21 (L) 22 - 32 mmol/L   Glucose, Bld 141 (H) 70 - 99 mg/dL   BUN 10 6 - 20 mg/dL   Creatinine, Ser 0.74 0.44 - 1.00 mg/dL   Calcium 9.0 8.9 - 10.3 mg/dL   Total Protein 7.6 6.5 - 8.1 g/dL   Albumin 4.2 3.5 - 5.0 g/dL   AST 18 15 - 41 U/L   ALT 12 0 - 44 U/L   Alkaline Phosphatase 84 38 - 126 U/L   Total Bilirubin 0.8 0.3 - 1.2 mg/dL   GFR calc non Af Amer >60 >60 mL/min   GFR calc Af Amer >60 >60 mL/min    Comment: (NOTE) The eGFR has  been calculated using the CKD EPI equation. This calculation has not been validated in all clinical situations. eGFR's persistently <60 mL/min signify possible Chronic Kidney Disease.    Anion gap 8 5 - 15    Comment: Performed at [facility], [address] [place], [place] [address]  Ethanol     Status: Abnormal   Collection Time: 12/11/17  4:47 PM  Result Value Ref Range   Alcohol, Ethyl (B) 22 (H) <10 mg/dL    Comment: (NOTE) Lowest detectable limit for serum alcohol is 10 mg/dL. For medical purposes only. Performed at [facility], [address] [place], [place] [address]   Salicylate level     Status: None   Collection Time:  12/11/17  4:47 PM  Result Value Ref Range   Salicylate Lvl <2.9 2.8 - 30.0 mg/dL    Comment: Performed at [facility], [address] [place], [place] [address]  Acetaminophen level     Status: Abnormal   Collection Time: 12/11/17  4:47 PM  Result Value Ref Range   Acetaminophen (Tylenol), Serum <10 (L) 10 - 30 ug/mL    Comment: (NOTE) Therapeutic concentrations vary significantly. A range of 10-30 ug/mL  may be an effective concentration for many patients. However, some  are best treated at concentrations outside of this range. Acetaminophen concentrations >150 ug/mL at 4 hours after ingestion  and >50 ug/mL at 12 hours after ingestion are often associated with  toxic reactions. Performed at [facility], [address] [place], [place] [address]   cbc     Status: None   Collection Time: 12/11/17  4:47 PM  Result Value Ref Range   WBC 7.7 3.6 - 11.0 K/uL   RBC 4.40 3.80 - 5.20 MIL/uL   Hemoglobin 14.3 12.0 - 16.0 g/dL   HCT 41.0 35.0 - 47.0 %   MCV 93.1 80.0 - 100.0 fL   MCH 32.4 26.0 - 34.0 pg   MCHC 34.8 32.0 - 36.0 g/dL   RDW 12.9 11.5 - 14.5 %   Platelets 285 150 - 440 K/uL    Comment: Performed at [facility], [address] [place], [place] [address]  Urine Drug Screen,  Qualitative     Status: Abnormal   Collection Time: 12/11/17  4:47 PM  Result Value Ref Range   Tricyclic, Ur Screen NONE DETECTED NONE DETECTED   Amphetamines, Ur Screen NONE DETECTED NONE DETECTED   MDMA (Ecstasy)Ur Screen NONE DETECTED NONE DETECTED   Cocaine Metabolite,Ur [place] NONE DETECTED NONE DETECTED   Opiate, Ur Screen NONE DETECTED NONE DETECTED   Phencyclidine (PCP) Ur S NONE DETECTED NONE DETECTED   Cannabinoid 50 Ng, Ur [place] and [place] NONE DETECTED NONE DETECTED   Barbiturates, Ur Screen (A) NONE DETECTED    Result not available. Reagent lot number recalled by manufacturer.   Benzodiazepine, Ur Scrn NONE DETECTED NONE DETECTED   Methadone Scn, Ur NONE DETECTED NONE DETECTED    Comment: (NOTE) Tricyclics + metabolites, urine    Cutoff 1000 ng/mL Amphetamines + metabolites, urine  Cutoff 1000 ng/mL MDMA (Ecstasy), urine              Cutoff 500 ng/mL Cocaine Metabolite, urine          Cutoff 300 ng/mL Opiate + metabolites, urine        Cutoff 300 ng/mL Phencyclidine (PCP), urine         Cutoff 25 ng/mL Cannabinoid, urine                 Cutoff 50 ng/mL Barbiturates + metabolites, urine  Cutoff 200 ng/mL Benzodiazepine, urine              Cutoff 200 ng/mL Methadone, urine                   Cutoff 300 ng/mL The urine drug screen provides only a preliminary, unconfirmed analytical test result and should not be used for non-medical purposes. Clinical consideration and professional judgment should be applied to any positive drug screen result due to possible interfering substances. A more specific alternate chemical method must be used in order to obtain a confirmed analytical result. Gas chromatography / mass spectrometry (GC/MS) is the preferred [employer]  method. Performed at [facility], [address] [place], [place] [address]   Pregnancy, urine POC     Status: None   Collection Time: 12/11/17  5:09 PM  Result Value Ref Range   Preg Test, Ur NEGATIVE NEGATIVE     Comment:        THE SENSITIVITY OF THIS METHODOLOGY IS >24 mIU/mL     No current facility-administered medications for this encounter.    Current Outpatient Medications  Medication Sig Dispense Refill  . naproxen (NAPROSYN) 500 MG tablet Take 1 tablet (500 mg total) by mouth 2 (two) times daily with a meal. 20 tablet 00  . oxyCODONE-acetaminophen (PERCOCET) 7.5-325 MG tablet Take 1 tablet by mouth every 6 (six) hours as needed for severe pain. 12 tablet 0    Musculoskeletal: Strength & Muscle Tone: within normal limits Gait & Station: normal Patient leans: N/A  Psychiatric Specialty Exam: Physical Exam  Nursing note and vitals reviewed. Constitutional: She appears well-developed and well-nourished.  HENT:  Head: Normocephalic and atraumatic.  Eyes: Pupils are equal, round, and reactive to light. Conjunctivae are normal.  Neck: Normal range of motion.  Cardiovascular: Regular rhythm and normal heart sounds.  Respiratory: Effort normal. No respiratory distress.  GI: Soft.  Musculoskeletal: Normal range of motion.  Neurological: She is alert.  Skin: Skin is warm and dry.  Psychiatric: Her speech is normal. Her mood appears anxious. She is agitated. She is not aggressive and not combative. Thought content is not paranoid. Cognition and memory are normal. She expresses impulsivity. She expresses no homicidal and no suicidal ideation.    Review of Systems  Constitutional: Negative.   HENT: Negative.   Eyes: Negative.   Respiratory: Negative.   Cardiovascular: Negative.   Gastrointestinal: Negative.   Musculoskeletal: Negative.   Skin: Negative.   Neurological: Negative.   Psychiatric/Behavioral: Negative for depression, hallucinations, memory loss, substance abuse and suicidal ideas. The patient has insomnia. The patient is not nervous/anxious.     Blood pressure (!) 144/77, pulse 75, temperature (!) 97.1 F (36.2 C), temperature source Oral, resp. rate 16, height 5' 3"  (1.6  m), weight 88.5 kg (195 lb), last menstrual period 11/11/2017, SpO2 98 %.Body mass index is 34.54 kg/m.  General Appearance: Casual  Eye Contact:  Good  Speech:  Clear and Coherent  Volume:  Normal  Mood:  Irritable  Affect:  Congruent  Thought Process:  Goal Directed  Orientation:  Full (Time, Place, and Person)  Thought Content:  Logical  Suicidal Thoughts:  No  Homicidal Thoughts:  No  Memory:  Immediate;   Fair Recent;   Fair Remote;   Fair  Judgement:  Impaired  Insight:  Shallow  Psychomotor Activity:  Normal  Concentration:  Concentration: Fair  Recall:  [employer] of Knowledge:  Fair  Language:  Fair  Akathisia:  No  Handed:  Right  AIMS (if indicated):     Assets:  Desire for Improvement Housing  ADL's:  Intact  Cognition:  WNL  Sleep:        Treatment Plan Summary: Plan On reassessment today the patient is clear that she is not having any suicidal thoughts.  She is requesting released from the emergency room.  Denies any suicidal thoughts at all.  No sign of psychosis.  Patient was counseled about her use of alcohol and how it is worsening her problems with sleep.  Patient strongly encouraged to be involved in some attempts to try to stop drinking and to  see her doctor about trying to work on her sleep regimen.  Urged to take care of her self and to get help if mood worsens or any suicidal ideation occurs.  Case reviewed with emergency room doctor and TTS.  I have discontinue the IV C as I believe he no longer meets commitment criteria and she can be discharged with recommendation she follow up with outpatient treatment in the community.  Disposition: No evidence of imminent risk to self or others at present.   Patient does not meet criteria for psychiatric inpatient admission. Supportive therapy provided about ongoing stressors. Discussed crisis plan, support from social network, calling 911, coming to the Emergency Department, and calling Suicide Hotline.  Alethia Berthold, MD 12/12/2017 5:15 PM

## 2017-12-12 NOTE — ED Notes (Signed)
Pt verbalizes d/c understanding and follow up, Pt in NAD at time of departure, VSS. Pt ambulatory, making phone call in lobby for ride.

## 2017-12-12 NOTE — ED Provider Notes (Signed)
-----------------------------------------   7:03 AM on 12/12/2017 -----------------------------------------   Blood pressure 122/89, pulse 84, temperature 97.8 F (36.6 C), temperature source Oral, resp. rate 20, height 5\' 3"  (1.6 m), weight 88.5 kg (195 lb), last menstrual period 11/11/2017, SpO2 99 %.  The patient had no acute events since last update.  Calm and cooperative at this time.  Disposition is pending Psychiatry/Behavioral Medicine team recommendations.     Irean HongSung, Jade J, MD 12/12/17 [phone]

## 2017-12-12 NOTE — Discharge Instructions (Addendum)
Please seek medical attention and help for any thoughts about wanting to harm yourself, harm others, any concerning change in behavior, severe depression, inappropriate drug use or any other new or concerning symptoms. ° °

## 2017-12-12 NOTE — ED Notes (Signed)
IVC/Consult completed/Pending Placement/ Referrals Faxed

## 2017-12-12 NOTE — ED Provider Notes (Signed)
Patient seen by Dr. Toni Amendlapacs who rescinded IVC.    Phineas SemenGoodman, Armond Cuthrell, MD 12/12/17 [phone]

## 2019-12-10 ENCOUNTER — Ambulatory Visit: Payer: Self-pay | Admitting: Internal Medicine

## 2019-12-10 DIAGNOSIS — Z0289 Encounter for other administrative examinations: Secondary | ICD-10-CM

## 2020-07-16 ENCOUNTER — Ambulatory Visit (INDEPENDENT_AMBULATORY_CARE_PROVIDER_SITE_OTHER): Payer: Managed Care, Other (non HMO) | Admitting: Obstetrics & Gynecology

## 2020-07-16 ENCOUNTER — Other Ambulatory Visit: Payer: Self-pay

## 2020-07-16 ENCOUNTER — Encounter: Payer: Self-pay | Admitting: Obstetrics & Gynecology

## 2020-07-16 ENCOUNTER — Other Ambulatory Visit (HOSPITAL_COMMUNITY)
Admission: RE | Admit: 2020-07-16 | Discharge: 2020-07-16 | Disposition: A | Discharge status: Home / Self Care | Payer: Managed Care, Other (non HMO) | Source: Ambulatory Visit | Attending: Obstetrics & Gynecology | Admitting: Obstetrics & Gynecology

## 2020-07-16 VITALS — BP 135/86 | HR 91 | Ht 63.0 in | Wt 243.8 lb

## 2020-07-16 DIAGNOSIS — Z01419 Encounter for gynecological examination (general) (routine) without abnormal findings: Secondary | ICD-10-CM

## 2020-07-16 DIAGNOSIS — B373 Candidiasis of vulva and vagina: Secondary | ICD-10-CM | POA: Insufficient documentation

## 2020-07-16 DIAGNOSIS — R102 Pelvic and perineal pain: Secondary | ICD-10-CM

## 2020-07-16 DIAGNOSIS — Z1231 Encounter for screening mammogram for malignant neoplasm of breast: Secondary | ICD-10-CM

## 2020-07-16 DIAGNOSIS — B3731 Acute candidiasis of vulva and vagina: Secondary | ICD-10-CM

## 2020-07-16 DIAGNOSIS — Z113 Encounter for screening for infections with a predominantly sexual mode of transmission: Secondary | ICD-10-CM | POA: Insufficient documentation

## 2020-07-16 DIAGNOSIS — B001 Herpesviral vesicular dermatitis: Secondary | ICD-10-CM | POA: Insufficient documentation

## 2020-07-16 DIAGNOSIS — Z0000 Encounter for general adult medical examination without abnormal findings: Secondary | ICD-10-CM

## 2020-07-16 MED ORDER — VALACYCLOVIR HCL 500 MG PO TABS: 500.0000 mg | ORAL_TABLET | Freq: Every day | ORAL | 3 refills | Status: AC

## 2020-07-16 NOTE — Progress Notes (Signed)
GYNECOLOGY ANNUAL PREVENTATIVE CARE ENCOUNTER NOTE  History:     Zoe Martin is a 45 y.o. [phone] female here for a routine annual gynecologic exam and to establish care. Has not seen an OB/GYN since birth of her child 18 years ago.  Current complaints: wants evaluation for pelvic pain and possible prolapse.  Pain is more evident on her right side especially during intercourse. Also has periodic vaginal dryness. As for the prolapse, husband reported "things are falling out down there". Had 3 vaginal deliveries, 10 lb infant delivery for last one. Also desires referral to [facility]/Primary care as she is concerned about possible thyroid anomalies and also about rheumatoid arthritis. Wants refill of Valtrex for oral blisters.   Denies abnormal vaginal bleeding, discharge, problems with incontinence or other gynecologic concerns.    Gynecologic History Patient's last menstrual period was 06/24/2020 (approximate). Contraception: none Last Pap: 18 years ago. Results were: normal with negative HPV Never had a mammogram  Obstetric History OB History  Gravida Para Term Preterm AB Living  4 3 3   1 3   SAB IAB Ectopic Multiple Live Births  1       3    # Outcome Date GA Lbr Len/2nd Weight Sex Delivery Anes PTL Lv  4 Term 08/01/01    F Vag-Spont EPI  LIV  3 SAB 2000          2 Term 04/27/97    M Vag-Spont EPI  LIV  1 Term 10/20/94    F Vag-Spont EPI  LIV    Past Medical History:  Diagnosis Date  . History of genital warts   . Recurrent cold sores     Past Surgical History:  Procedure Laterality Date  . KNEE SURGERY    . TUBAL LIGATION    . WISDOM TOOTH EXTRACTION      No current outpatient medications on file prior to visit.   No current facility-administered medications on file prior to visit.    No Known Allergies  Social History:  reports that she has never smoked. She has never used smokeless tobacco. She reports current alcohol use. She reports previous drug  use.  Family History  Problem Relation Age of Onset  . Hypertension Father     The following portions of the patient's history were reviewed and updated as appropriate: allergies, current medications, past family history, past medical history, past social history, past surgical history and problem list.  Review of Systems Pertinent items noted in HPI and remainder of comprehensive ROS otherwise negative.  Physical Exam:  BP 135/86   Pulse 91   Ht 5\' 3"  (1.6 m)   Wt 243 lb 12.8 oz (110.6 kg)   LMP 06/24/2020 (Approximate)   BMI 43.19 kg/m  CONSTITUTIONAL: Well-developed, well-nourished female in no acute distress.  HENT:  Normocephalic, atraumatic, External right and left ear normal.  EYES: Conjunctivae and EOM are normal. Pupils are equal, round, and reactive to light. No scleral icterus.  NECK: Normal range of motion, supple, no masses.  Normal thyroid.  SKIN: Skin is warm and dry. No rash noted. Not diaphoretic. No erythema. No pallor. MUSCULOSKELETAL: Normal range of motion. No tenderness.  No cyanosis, clubbing, or edema. NEUROLOGIC: Alert and oriented to person, place, and time. Normal reflexes, muscle tone coordination.  PSYCHIATRIC: Normal mood and affect. Normal behavior. Normal judgment and thought content. CARDIOVASCULAR: Normal heart rate noted, regular rhythm RESPIRATORY: Clear to auscultation bilaterally. Effort and breath sounds normal, no problems with respiration  noted. BREASTS: Symmetric in size. No masses, tenderness, skin changes, nipple drainage, or lymphadenopathy bilaterally. Performed in the presence of a chaperone. ABDOMEN: Soft, obese, no distention noted.  No tenderness, rebound or guarding.  PELVIC: Normal appearing external genitalia and urethral meatus; normal appearing vaginal mucosa and multiparous cervix.  Brown discharge noted, testing sample obtained.  Pap smear obtained.  No prolapse noted, even with Valsalva.  Right adnexal tenderness and mild  uterine tenderness, unable to palpate fully due to habitus.  Performed in the presence of a chaperone.   Assessment and Plan:      1. Pelvic pain in female Unsure of etiology, will follow up vaginitis evaluation. Pelvic ultrasound ordered, will follow up results and manage accordingly. NSAIDs recommended as needed. - US PELVIC COMPLETE WITH TRANSVAGINAL; Future - Cervicovaginal ancillary only( [place])  2. Herpes labialis Valtrex prescribed. - valACYclovir (VALTREX) 500 MG tablet; Take 1 tablet (500 mg total) by mouth daily. For every active episode can take 4 tablets at once to help treat the oral blister. Otherwise take for suppression as directed.  Dispense: 90 tablet; Refill: 3  3. Routine adult health maintenance Referred to [facility] - Ambulatory referral to [facility]  4. Breast cancer screening by mammogram Mammogram scheduled - MM 3D SCREEN BREAST BILATERAL; Future  5. Well woman exam with routine gynecological exam - Cytology - PAP Will follow up results of pap smear and manage accordingly. Routine preventative health maintenance measures emphasized. Please refer to After Visit Summary for other counseling recommendations.      Jaynie Collins, MD, FACOG Obstetrician & Gynecologist, [facility] for [employer], [facility]

## 2020-07-16 NOTE — Patient Instructions (Signed)
Preventive Care 84-47 Years Old, Female Preventive care refers to lifestyle choices and visits with your health care provider that can promote health and wellness. This includes:  A yearly physical exam. This is also called an annual wellness visit.  Regular dental and eye exams.  Immunizations.  Screening for certain conditions.  Healthy lifestyle choices, such as: ? Eating a healthy diet. ? Getting regular exercise. ? Not using drugs or products that contain nicotine and tobacco. ? Limiting alcohol use. What can I expect for my preventive care visit? Physical exam Your health care provider will check your:  Height and weight. These may be used to calculate your BMI (body mass index). BMI is a measurement that tells if you are at a healthy weight.  Heart rate and blood pressure.  Body temperature.  Skin for abnormal spots. Counseling Your health care provider may ask you questions about your:  Past medical problems.  Family's medical history.  Alcohol, tobacco, and drug use.  Emotional well-being.  Home life and relationship well-being.  Sexual activity.  Diet, exercise, and sleep habits.  Work and work Statistician.  Access to firearms.  Method of birth control.  Menstrual cycle.  Pregnancy history. What immunizations do I need? Vaccines are usually given at various ages, according to a schedule. Your health care provider will recommend vaccines for you based on your age, medical history, and lifestyle or other factors, such as travel or where you work.   What tests do I need? Blood tests  Lipid and cholesterol levels. These may be checked every 5 years, or more often if you are over 47 years old.  Hepatitis C test.  Hepatitis B test. Screening  Lung cancer screening. You may have this screening every year starting at age 47 if you have a 30-pack-year history of smoking and currently smoke or have quit within the past 15 years.  Colorectal cancer  screening. ? All adults should have this screening starting at age 47 and continuing until age 47. ? Your health care provider may recommend screening at age 47 if you are at increased risk. ? You will have tests every 1-10 years, depending on your results and the type of screening test.  Diabetes screening. ? This is done by checking your blood sugar (glucose) after you have not eaten for a while (fasting). ? You may have this done every 1-3 years.  Mammogram. ? This may be done every 1-2 years. ? Talk with your health care provider about when you should start having regular mammograms. This may depend on whether you have a family history of breast cancer.  BRCA-related cancer screening. This may be done if you have a family history of breast, ovarian, tubal, or peritoneal cancers.  Pelvic exam and Pap test. ? This may be done every 3 years starting at age 47. ? Starting at age 47, this may be done every 5 years if you have a Pap test in combination with an HPV test. Other tests  STD (sexually transmitted disease) testing, if you are at risk.  Bone density scan. This is done to screen for osteoporosis. You may have this scan if you are at high risk for osteoporosis. Talk with your health care provider about your test results, treatment options, and if necessary, the need for more tests. Follow these instructions at home: Eating and drinking  Eat a diet that includes fresh fruits and vegetables, whole grains, lean protein, and low-fat dairy products.  Take vitamin and mineral supplements  as recommended by your health care provider.  Do not drink alcohol if: ? Your health care provider tells you not to drink. ? You are pregnant, may be pregnant, or are planning to become pregnant.  If you drink alcohol: ? Limit how much you have to 0-1 drink a day. ? Be aware of how much alcohol is in your drink. In the [place] one drink equals one 12 oz bottle of beer (355 mL), one 5 oz glass of  wine (148 mL), or one 1 oz glass of hard liquor (44 mL).   Lifestyle  Take daily care of your teeth and gums. Brush your teeth every morning and night with fluoride toothpaste. Floss one time each day.  Stay active. Exercise for at least 30 minutes 5 or more days each week.  Do not use any products that contain nicotine or tobacco, such as cigarettes, e-cigarettes, and chewing tobacco. If you need help quitting, ask your health care provider.  Do not use drugs.  If you are sexually active, practice safe sex. Use a condom or other form of protection to prevent STIs (sexually transmitted infections).  If you do not wish to become pregnant, use a form of birth control. If you plan to become pregnant, see your health care provider for a prepregnancy visit.  If told by your health care provider, take low-dose aspirin daily starting at age 50.  Find healthy ways to cope with stress, such as: ? Meditation, yoga, or listening to music. ? Journaling. ? Talking to a trusted person. ? Spending time with friends and family. Safety  Always wear your seat belt while driving or riding in a vehicle.  Do not drive: ? If you have been drinking alcohol. Do not ride with someone who has been drinking. ? When you are tired or distracted. ? While texting.  Wear a helmet and other protective equipment during sports activities.  If you have firearms in your house, make sure you follow all gun safety procedures. What's next?  Visit your health care provider once a year for an annual wellness visit.  Ask your health care provider how often you should have your eyes and teeth checked.  Stay up to date on all vaccines. This information is not intended to replace advice given to you by your health care provider. Make sure you discuss any questions you have with your health care provider. Document Revised: 02/11/2020 Document Reviewed: 01/18/2018 [employer] Patient Education  2021 [employer]  

## 2020-07-20 LAB — CERVICOVAGINAL ANCILLARY ONLY
Bacterial Vaginitis (gardnerella): NEGATIVE
Candida Glabrata: NEGATIVE
Candida Vaginitis: POSITIVE — AB
Chlamydia: NEGATIVE
Comment: NEGATIVE
Comment: NEGATIVE
Comment: NEGATIVE
Comment: NEGATIVE
Comment: NEGATIVE
Comment: NORMAL
Neisseria Gonorrhea: NEGATIVE
Trichomonas: NEGATIVE

## 2020-07-20 LAB — CYTOLOGY - PAP
Comment: NEGATIVE
Diagnosis: NEGATIVE
High risk HPV: NEGATIVE

## 2020-07-20 MED ORDER — FLUCONAZOLE 150 MG PO TABS: 150.0000 mg | ORAL_TABLET | Freq: Once | ORAL | 1 refills | Status: AC

## 2020-07-20 NOTE — Addendum Note (Signed)
Addended by: Jaynie Collins A on: 07/20/2020 03:10 PM   Modules accepted: Orders

## 2020-07-22 ENCOUNTER — Ambulatory Visit: Payer: Managed Care, Other (non HMO)

## 2020-07-22 ENCOUNTER — Telehealth: Payer: Self-pay | Admitting: *Deleted

## 2020-07-22 NOTE — Telephone Encounter (Signed)
-----   Message from Zoe Newcomer, MD sent at 07/20/2020  3:10 PM EST ----- Vaginal discharge test is abnormal and showed yeast vaginitis.  Diflucan prescribed. Please inform patient of results, advise to pick up prescribed medication and take as directed.

## 2020-07-22 NOTE — Telephone Encounter (Signed)
Pt informed of results and medication sent in .  

## 2020-09-04 ENCOUNTER — Inpatient Hospital Stay: Admission: RE | Admit: 2020-09-04 | Payer: Managed Care, Other (non HMO) | Source: Ambulatory Visit

## 2021-10-08 ENCOUNTER — Other Ambulatory Visit: Payer: Self-pay | Admitting: Physician Assistant

## 2021-10-08 ENCOUNTER — Ambulatory Visit
Admission: RE | Admit: 2021-10-08 | Discharge: 2021-10-08 | Disposition: A | Discharge status: Home / Self Care | Payer: Managed Care, Other (non HMO) | Source: Ambulatory Visit | Attending: Physician Assistant | Admitting: Physician Assistant

## 2021-10-08 DIAGNOSIS — R079 Chest pain, unspecified: Secondary | ICD-10-CM

## 2023-03-22 IMAGING — CR DG CHEST 2V
2 series · 2 of 2 positions shown · non-contrast
Comparison: Radiograph 07/05/2006

CLINICAL DATA: Chest pain. Patient reports anterior lower chest
pain on the left for 1 year.

EXAM:
CHEST - 2 VIEW

[w chest pa]
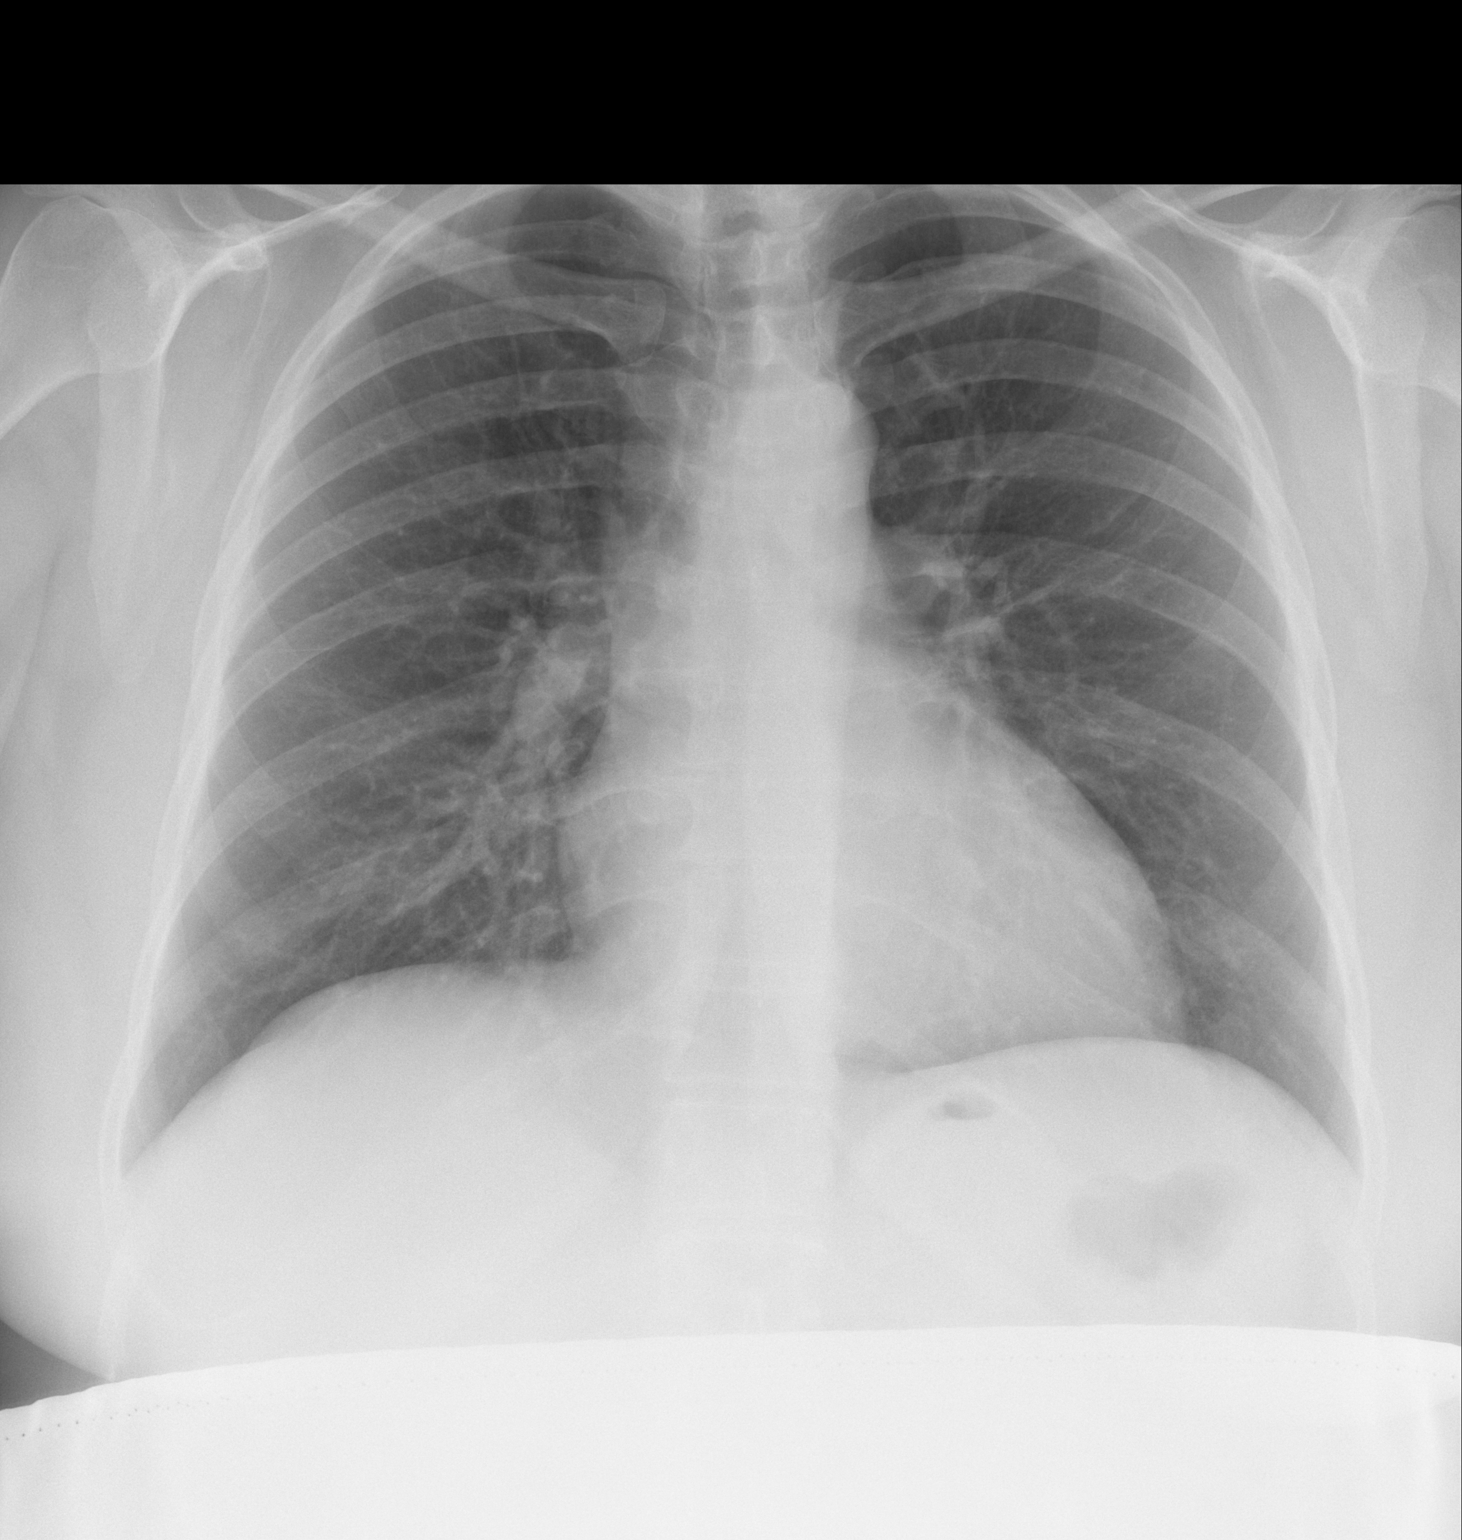

[w chest lat]
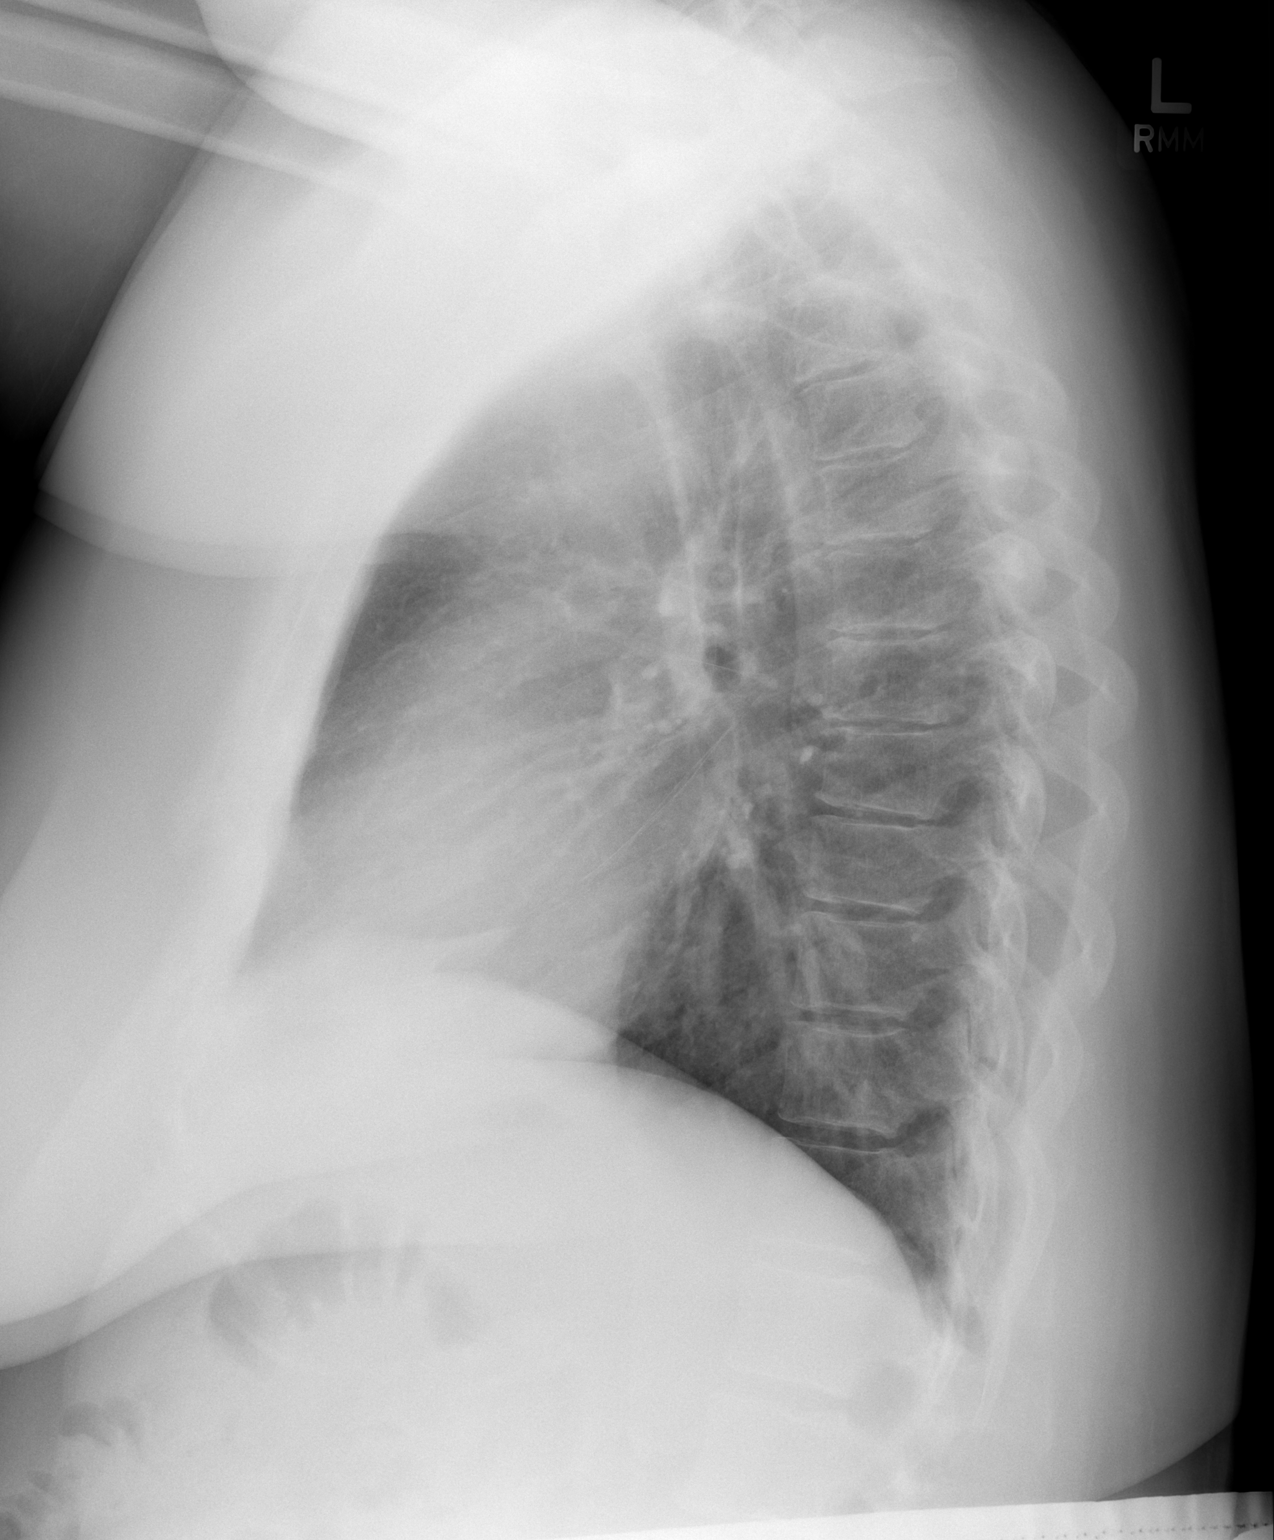

[2 of 2 positions shown; findings below may reference images not displayed]

FINDINGS: The cardiomediastinal contours are normal. The lungs are clear.
Pulmonary vasculature is normal. No consolidation, pleural effusion,
or pneumothorax. No pulmonary mass or visualized nodule. No acute
osseous abnormalities are seen.
IMPRESSION: Negative radiographs of the chest. No acute findings or explanation
for pain.
